# Patient Record
Sex: Female | Born: 1955 | Race: Black or African American | Hispanic: No | Marital: Married | Smoking: Never smoker
Health system: Southern US, Community
[De-identification: ages and names within clinical notes are randomized; demographics above are authoritative.]

## PROBLEM LIST (undated history)

## (undated) DIAGNOSIS — K5900 Constipation, unspecified: Secondary | ICD-10-CM

## (undated) DIAGNOSIS — R002 Palpitations: Secondary | ICD-10-CM

## (undated) DIAGNOSIS — F419 Anxiety disorder, unspecified: Secondary | ICD-10-CM

## (undated) DIAGNOSIS — I471 Supraventricular tachycardia: Secondary | ICD-10-CM

## (undated) DIAGNOSIS — D649 Anemia, unspecified: Secondary | ICD-10-CM

## (undated) HISTORY — PX: SUPRACERVICAL ABDOMINAL HYSTERECTOMY: SHX5393

## (undated) HISTORY — DX: Supraventricular tachycardia: I47.1

## (undated) HISTORY — DX: Anemia, unspecified: D64.9

## (undated) HISTORY — DX: Constipation, unspecified: K59.00

## (undated) HISTORY — DX: Palpitations: R00.2

## (undated) HISTORY — DX: Anxiety disorder, unspecified: F41.9

---

## 1998-06-23 ENCOUNTER — Other Ambulatory Visit: Admission: RE | Admit: 1998-06-23 | Discharge: 1998-06-23 | Payer: Self-pay | Admitting: Obstetrics and Gynecology

## 1999-07-30 ENCOUNTER — Other Ambulatory Visit: Admission: RE | Admit: 1999-07-30 | Discharge: 1999-07-30 | Payer: Self-pay | Admitting: Obstetrics and Gynecology

## 2000-09-30 ENCOUNTER — Other Ambulatory Visit: Admission: RE | Admit: 2000-09-30 | Discharge: 2000-09-30 | Payer: Self-pay | Admitting: Obstetrics and Gynecology

## 2000-10-28 ENCOUNTER — Encounter (INDEPENDENT_AMBULATORY_CARE_PROVIDER_SITE_OTHER): Payer: Self-pay

## 2000-10-28 ENCOUNTER — Other Ambulatory Visit: Admission: RE | Admit: 2000-10-28 | Discharge: 2000-10-28 | Payer: Self-pay | Admitting: Obstetrics and Gynecology

## 2001-04-07 ENCOUNTER — Encounter: Payer: Self-pay | Admitting: Family Medicine

## 2001-04-07 ENCOUNTER — Encounter: Admission: RE | Admit: 2001-04-07 | Discharge: 2001-04-07 | Payer: Self-pay | Admitting: Family Medicine

## 2001-05-28 ENCOUNTER — Encounter: Payer: Self-pay | Admitting: General Surgery

## 2001-06-03 ENCOUNTER — Observation Stay (HOSPITAL_COMMUNITY): Admission: RE | Admit: 2001-06-03 | Discharge: 2001-06-04 | Payer: Self-pay | Admitting: General Surgery

## 2001-06-03 ENCOUNTER — Encounter (INDEPENDENT_AMBULATORY_CARE_PROVIDER_SITE_OTHER): Payer: Self-pay | Admitting: Specialist

## 2002-01-12 ENCOUNTER — Other Ambulatory Visit: Admission: RE | Admit: 2002-01-12 | Discharge: 2002-01-12 | Payer: Self-pay | Admitting: Obstetrics and Gynecology

## 2002-06-23 HISTORY — PX: CHOLECYSTECTOMY: SHX55

## 2003-02-13 ENCOUNTER — Other Ambulatory Visit: Admission: RE | Admit: 2003-02-13 | Discharge: 2003-02-13 | Payer: Self-pay | Admitting: Obstetrics and Gynecology

## 2003-06-19 ENCOUNTER — Encounter (INDEPENDENT_AMBULATORY_CARE_PROVIDER_SITE_OTHER): Payer: Self-pay | Admitting: Specialist

## 2003-06-19 ENCOUNTER — Inpatient Hospital Stay (HOSPITAL_COMMUNITY): Admission: RE | Admit: 2003-06-19 | Discharge: 2003-06-22 | Payer: Self-pay | Admitting: Obstetrics and Gynecology

## 2006-02-19 ENCOUNTER — Other Ambulatory Visit: Admission: RE | Admit: 2006-02-19 | Discharge: 2006-02-19 | Payer: Self-pay | Admitting: Family Medicine

## 2010-01-08 ENCOUNTER — Other Ambulatory Visit: Admission: RE | Admit: 2010-01-08 | Discharge: 2010-01-08 | Payer: Self-pay | Admitting: Family Medicine

## 2010-01-15 ENCOUNTER — Ambulatory Visit (HOSPITAL_COMMUNITY): Admission: RE | Admit: 2010-01-15 | Discharge: 2010-01-15 | Payer: Self-pay | Admitting: Family Medicine

## 2010-06-23 DIAGNOSIS — I471 Supraventricular tachycardia: Secondary | ICD-10-CM

## 2010-06-23 HISTORY — DX: Supraventricular tachycardia: I47.1

## 2010-11-08 NOTE — Op Note (Signed)
NAME:  Brittany Murphy, Brittany Murphy                         ACCOUNT NO.:  [id number]   MEDICAL RECORD NO.:  [id number]                   PATIENT TYPE:  INP   LOCATION:  9399                                 FACILITY:  [facility]   PHYSICIAN:  Charles A. Sydnee Cabal, MD            DATE OF BIRTH:  09/08/1955   DATE OF PROCEDURE:  06/19/2003  DATE OF DISCHARGE:                                 OPERATIVE REPORT   PREOPERATIVE DIAGNOSIS:  Uterine fibroids leading to menorrhagia.   POSTOPERATIVE DIAGNOSIS:  Uterine fibroids leading to menorrhagia.   PROCEDURE:  Transabdominal hysterectomy, supracervical type.   SURGEON:  Charles A. Sydnee Cabal, M.D.   ASSISTANT:  Rudy Jew. Ashley Royalty, M.D.   COMPLICATIONS:  None.   ESTIMATED BLOOD LOSS:  100 mL.   FINDINGS:  Uterine leiomyomata, a 301 g uterus.  Instrument, sponge, and  needle count were correct x2.   ANESTHESIA:  General by the endotracheal route.   DESCRIPTION OF PROCEDURE:  The patient was taken to the operating room,  placed in the supine position, and a general anesthetic was induced without  difficulty.  Sterile prep and drape was then undertaken.  A Pfannenstiel  incision was made with a knife, carried down to fascia.  The fascia was  incised with a knife and Mayo scissors.  The rectus muscles were sharply  dissected in the midline.  The rectus sheath was released superiorly and  inferiorly.  The peritoneum was entered with a hemostat.  There was no  damage to bowel, bladder, or vascular structures.  The peritoneal incision  was extended superiorly and inferiorly.  There was no damage to the bladder.  A Balfour retractor was placed.  Three moistened laps were used to pack the  bowel out of the pelvis.  The uterine cornual regions were grasped with  Kocher clamps.  The round ligaments were transected on either side and  transfixed and stitched with 0 Vicryl.  The broad ligament was opened.  The  bladder was dropped down anteriorly off the lower uterine  segment.  Utero-  ovarian pedicles were isolated on either side, crossclamped, and ovarian  pedicles were free-tied and then transfixed and stitched with 0 Vicryl, with  good hemostasis resulting.  The uterine vessels were skeletonized  bilaterally.  The bladder was taken down carefully anteriorly, allowing the  uterine vessels to be skeletonized.  These were crossclamped and simple-  stitched with good hemostasis with 0 Vicryl on either side.  Further pedicle  was taken down to include some of the cardinal ligaments on either side to  expose the lower uterine segment and cervix area.  These were transfixed and  stitched with 0 Vicryl with good hemostasis resulting.  Per patient request,  a supracervical hysterectomy was then done with cutting across the isthmus  of the lower uterine segment.  This was then closed with interrupted figure-  of-eight sutures of 0 Vicryl with good hemostasis resulting.  A single 2-0  Vicryl figure-of-eight suture was used to achieve hemostasis on the  cervical/isthmic stump.  Irrigation was carried out.  All pedicles were  noted to have excellent hemostasis.  Cervix hemostasis was good.  All  instruments were removed. Laps were removed.  Balfour retractor was removed.  Subfascial hemostasis was verified.  The fascia was then  closed with #1 Vicryl running, nonlocking suture.  Subcutaneous hemostasis  was good.  Sterile skin clips were used to close the skin.  A sterile  dressing was applied.  The patient tolerated the procedure well and was  taken to recovery in stable condition.                                               Charles A. Sydnee Cabal, MD    CAD/MEDQ  D:  06/19/2003  T:  06/19/2003  Job:  [id number]

## 2010-11-08 NOTE — Op Note (Signed)
[facility]  Patient:    Brittany Murphy, Brittany Murphy Visit Number: [id number] MRN: [id number]          Service Type: SUR Location: 4W [id number] Attending Physician:  Caleen Essex Dictated by:   Ollen Gross. Vernell Morgans, M.D. Proc. Date: 06/03/01 Admit Date:  06/03/2001 Discharge Date: 06/04/2001                             Operative Report  PREOPERATIVE DIAGNOSIS:  Cholelithiasis.  POSTOPERATIVE DIAGNOSIS:  Cholecystitis with cholelithiasis.  PROCEDURE:  Laparoscopic cholecystectomy.  SURGEON:  Ollen Gross. Vernell Morgans, M.D.  ASSISTANT:  Rose Phi. Maple Hudson, M.D.  ANESTHESIA:  General endotracheal.  DESCRIPTION OF PROCEDURE:  After informed consent was obtained, the patient was brought to the operating room and placed in the supine position on the operating table.  After adequate induction of general anesthesia, the patients abdomen was prepped with Betadine and draped in the usual sterile manner.  The area above the umbilicus was infiltrated with 0.25% Marcaine, and a small transverse incision was made with the 15 blade knife.  This incision was carried down through the subcutaneous tissue using blunt dissection with a Kelly clamp and [employer] retractors until the linea alba was identified.  The linea alba was incised with the 15 blade knife, and each side was grasped with Kocher clamps and elevated anteriorly.  The preperitoneal space was probed bluntly with a hemostat until the peritoneum was open and access was gained to the abdominal cavity.  A finger was inserted through this opening to palpate the anterior abdominal wall, and there were no apparent adhesions.  A 0 Vicryl pursestring stitch was placed in the fascia surrounding this opening, and a Hasson cannula was placed through this opening into the abdominal cavity and anchored in place with the previously-placed Vicryl pursestring stitch.  The abdomen was then insufflated with carbon dioxide without difficulty.   The patient was placed in the head-up position.  A small transverse upper midline incision was made after infiltrating this area with 0.25% Marcaine, and a 10 mm port was placed bluntly through this incision, into the abdomen cavity under direct vision.  Sites were then chosen laterally on the right side of the  right side of the abdomen for placement of 5 mm ports, and each of these areas were infiltrated with 0.25% Marcaine.  Small stab incisions were made with the 15 blade knife, and 5 mm ports were placed bluntly through these incisions into the abdominal cavity under direct vision.  A blunt grasper was placed through the lateral-most 5 mm port and used to grasp the dome of the gallbladder and elevate it anteriorly and superiorly.  Another blunt grasper was placed through the other 5 mm port and used to retract on the body and neck of the gallbladder.  A blunt dissector was placed through the upper midline port and using the electrocautery, the peritoneal reflection over top of the gallbladder neck area was opened.  Blunt dissection was then carried out in this area until the gallbladder neck cystic duct junction was readily identified and dissected in a circumferential manner, creating a good window. Care was taken to keep the common duct medial to the dissection.  Three clips were then placed proximally and one distally on the cystic duct, and the duct was divided between the two.  Posterior to this, the cystic artery was identified and dissected bluntly in a circumferential manner until  a good window was created.  Two clips were placed proximally and one distally on the artery, and the artery was divided between the two.  Next, a laparoscopic hook cautery was used to separate the gallbladder from the liver bed.  Prior to completely detaching the gallbladder from the liver bed, the liver bed was inspected, and several small bleeding points were coagulated with the Bovie electrocautery  until the liver bed was hemostatic.  The gallbladder was then detached the rest of the way from the liver bed using the hook cautery.  The laparoscope was then moved to the upper midline port, and a gallbladder grasper was placed through the Hasson cannula to grasp the neck of the gallbladder.  The gallbladder was then removed with the Hasson cannula through the supraumbilical port.  The Hasson cannula was replaced, and the laparoscope was moved back to the Hasson cannula.  The abdomen was inspected again, and the liver bed was found to be hemostatic.  The abdomen was then irrigated with copious amounts of saline until the effluent was clear.  The laparoscope was removed back to the upper midline port, and the Hasson cannula was removed, and the fascial defect was closed with the previously-placed Vicryl pursestring stitch under direct vision.  The rest of the ports were also removed under direct vision after allowing the air to escape and were found to be hemostatic.  The skin incisions were then closed with interrupted 4-0 Monocryl subcuticular stitches.  Benzoin, Steri-Strips were applied.  The patient tolerated the procedure well.  At the end of case, all sponge, needle, and instrument counts were correct.  The patient was awakened and taken to the recovery room in stable condition. Dictated by:   Ollen Gross. Vernell Morgans, M.D. Attending Physician:  Caleen Essex DD:  07/13/01 TD:  07/14/01 Job: [id number]

## 2010-11-08 NOTE — Discharge Summary (Signed)
NAME:  Brittany Murphy, Brittany Murphy                         ACCOUNT NO.:  [id number]   MEDICAL RECORD NO.:  [id number]                   PATIENT TYPE:  INP   LOCATION:  9309                                 FACILITY:  [facility]   PHYSICIAN:  Charles A. Sydnee Cabal, MD            DATE OF BIRTH:  May 15, 1956   DATE OF ADMISSION:  06/19/2003  DATE OF DISCHARGE:  06/22/2003                                 DISCHARGE SUMMARY   PRIMARY DISCHARGE DIAGNOSIS:  Uterine fibroids.   PROCEDURE:  Transabdominal supracervical hysterectomy.   HISTORY AND PHYSICAL:  See dictated note on the chart.   LABORATORY DATA:  Postoperative hemoglobin 11.4, hematocrit 32.6.   HOSPITAL COURSE:  The patient was admitted, underwent surgery as noted above  secondary to menorrhagia and other symptoms from the uterine fibroids.  Postoperatively, the patient did well and had a routine postoperative  course.  She voided without difficulty on postoperative day #1.  Clear  liquids were given.  She had spontaneous return of flatus on postoperative  day #2.  Was given general diet.  Pain was controlled after PCA was  discontinued on postoperative day #1, having used the Dilaudid PCA, been  changed over to Percocet p.o.  She ambulated, continued to do well, pain was  controlled, diet was tolerated, flatus continued, bowel function was normal,  voiding was normal, and she was therefore discharged on postoperative day  #3.  She was given convalescent instructions of temperature greater than  101, increased pain or vaginal bleeding.  No driving for two weeks, no  lifting greater than 20 pounds.  Return to clinic in 24 hours to DC staples.  Percocet 5/325 one to two p.o. q.4h. p.r.n. #40 prescription was given with  instructions.                                               Charles A. Sydnee Cabal, MD    CAD/MEDQ  D:  06/22/2003  T:  06/22/2003  Job:  [id number]

## 2011-01-17 ENCOUNTER — Other Ambulatory Visit: Payer: Self-pay | Admitting: Family Medicine

## 2011-01-17 DIAGNOSIS — R51 Headache: Secondary | ICD-10-CM

## 2011-01-21 ENCOUNTER — Ambulatory Visit
Admission: RE | Admit: 2011-01-21 | Discharge: 2011-01-21 | Disposition: A | Discharge status: Home / Self Care | Payer: BC Managed Care – PPO | Source: Ambulatory Visit | Attending: Family Medicine | Admitting: Family Medicine

## 2011-01-21 DIAGNOSIS — R51 Headache: Secondary | ICD-10-CM

## 2011-03-10 ENCOUNTER — Emergency Department (HOSPITAL_COMMUNITY): Payer: BC Managed Care – PPO

## 2011-03-10 ENCOUNTER — Inpatient Hospital Stay (HOSPITAL_COMMUNITY)
Admission: EM | Admit: 2011-03-10 | Discharge: 2011-03-11 | DRG: 139 | Disposition: A | Discharge status: Home / Self Care | Payer: BC Managed Care – PPO | Attending: Cardiology | Admitting: Cardiology

## 2011-03-10 DIAGNOSIS — R42 Dizziness and giddiness: Secondary | ICD-10-CM | POA: Diagnosis present

## 2011-03-10 DIAGNOSIS — I498 Other specified cardiac arrhythmias: Principal | ICD-10-CM | POA: Diagnosis present

## 2011-03-10 DIAGNOSIS — R000 Tachycardia, unspecified: Secondary | ICD-10-CM

## 2011-03-10 LAB — COMPREHENSIVE METABOLIC PANEL
ALT: 38 U/L — ABNORMAL HIGH (ref 0–35)
BUN: 18 mg/dL (ref 6–23)
CO2: 28 mEq/L (ref 19–32)
Calcium: 9.8 mg/dL (ref 8.4–10.5)
Creatinine, Ser: 0.9 mg/dL (ref 0.50–1.10)
GFR calc Af Amer: >60 mL/min (ref >60)
GFR calc non Af Amer: >60 mL/min (ref >60)
Glucose, Bld: 100 mg/dL — ABNORMAL HIGH (ref 70–99)

## 2011-03-10 LAB — POCT I-STAT TROPONIN I: Troponin i, poc: 0.46 ng/mL (ref 0.00–0.08)

## 2011-03-10 LAB — CBC
HCT: 37.5 % (ref 36.0–46.0)
Hemoglobin: 12.8 g/dL (ref 12.0–15.0)
MCH: 29.8 pg (ref 26.0–34.0)
MCV: 87.4 fL (ref 78.0–100.0)
RBC: 4.29 MIL/uL (ref 3.87–5.11)

## 2011-03-10 LAB — DIFFERENTIAL
Lymphs Abs: 2.5 10*3/uL (ref 0.7–4.0)
Monocytes Absolute: 0.6 10*3/uL (ref 0.1–1.0)
Monocytes Relative: 8 % (ref 3–12)
Neutro Abs: 4.9 10*3/uL (ref 1.7–7.7)
Neutrophils Relative %: 61 % (ref 43–77)

## 2011-03-10 LAB — CK TOTAL AND CKMB (NOT AT ARMC): CK, MB: 4.2 ng/mL — ABNORMAL HIGH (ref 0.3–4.0)

## 2011-03-11 LAB — LIPID PANEL
Cholesterol: 133 mg/dL (ref 0–200)
VLDL: 14 mg/dL (ref 0–40)

## 2011-03-11 LAB — CARDIAC PANEL(CRET KIN+CKTOT+MB+TROPI)
CK, MB: 4 ng/mL (ref 0.3–4.0)
Total CK: 63 U/L (ref 7–177)

## 2011-03-21 NOTE — Discharge Summary (Signed)
Brittany Murphy, Brittany Murphy               ACCOUNT NO.:  [id number]  MEDICAL RECORD NO.:  [id number]  LOCATION:  3701                         FACILITY:  [facility]  PHYSICIAN:  Jake Bathe, MD      DATE OF BIRTH:  [birth year]  DATE OF ADMISSION:  03/10/2011 DATE OF DISCHARGE:  03/11/2011                              DISCHARGE SUMMARY   CARDIOLOGIST:  Jake Bathe, MD  PRIMARY PHYSICIAN:  [name], MD, [facility] Sprang  FINAL DIAGNOSES: 1. Supraventricular tachycardia. 2. Mildly elevated troponin in the setting of tachycardia. 3. Dizziness. 4. Prior history of anemia. 5. Cholecystectomy. 6. Hysterectomy.  BRIEF HOSPITAL COURSE:  This is a 55 year old female, originally from [place], with no prior cardiac history except for occasional bursts of brief palpitations who was in her usual state of health until yesterday morning developed a rapid heart rate that was incessant.  She felt her heart beating "fast" and felt somewhat lightheaded and had some mild blurry vision associated with this as well as dizziness.  She started to feel better and at one point decided that she would not go to the doctor and then because the heart rate continued without any resolution, she ended up going to see Dr. Maurice Small at [facility] and an EKG was performed which demonstrated narrow complex tachycardia at approximately 150 beats per minute, specifically 146 beats per minute with no clear P-wave preceding the QRS complex but perhaps a retrograde P-wave following.  She did not describe any chest discomfort or arm discomfort, but she said she felt as though it is a little bit more difficult to move her jaw.  Carotid massage was tried but it did not break the arrhythmia, so she went to the emergency room.  She did convert to normal sinus rhythm with IV Cardizem shortly thereafter. Point-of-care cardiac troponin did increase slightly, likely as a result of the tachycardia from 0.1 to 0.4, to 0.7.   Currently, trending down to 0.6 with an MB of 4 and a CK of 63.  Her TSH was normal as well as her free T4 and her electrolytes were normal with a potassium of 4.7, sodium 137, BUN 18, creatinine 0.9, ALT slightly above normal at 38 and AST slightly above normal at 43, albumin was 4.1.  Her chest x-ray personally viewed shows no acute findings.  On morning of discharge, she was in sinus rhythm all night with a blood pressure ranging between 115/74 to 101/68, satting 96% on room air, temperature 97.7.  She denies any recent fevers, chills, arthralgias, rashes, amphetamine use, decongestant use, excessive caffeine use.  She does drink a few cups of tea.  This morning, she feels as though she is in her usual state of health.  She feels well.  DISCHARGE MEDICATIONS:  Dr. Antoine Poche started her on a very low-dose beta- blocker, metoprolol tartrate 12.5 mg twice a day and we will continue with this low dose.  She does have a low normal blood pressure, and we do not want to drop this too much.  If she were to have tachycardia despite her beta-blocker therapy, we would at that point move forward with EP study/EP consultation with ablative  therapy.  Other home meds include multivitamin; Vivelle-Dot 0.0375 mg per 24-hour patch biweekly, one patch to skin twice a week.  Once again, she is a nonsmoker, nondiabetic.  No early family history of coronary artery disease.  No family history of early sudden death.  Since her mildly elevated troponin is trending downward and is likely a result of the incessant tachycardia, I feel comfortable allowing her to be discharged with close outpatient stress test.  We will go ahead and get her set up for a nuclear stress test and an echocardiogram to demonstrate her structure, function and to make sure that she does not have any high risk perfusion defects.  We will continue her low-dose metoprolol.  DISCHARGE TIME:  Thirty-five minutes spent with med  reconciliation, review of records, the patient instruction.  I am fine with her being out of work until the stress test is completed.     Jake Bathe, MD     MCS/MEDQ  D:  03/11/2011  T:  03/11/2011  Job:  [id number]  cc:   [name], M.D.  Electronically Signed by Donato Schultz MD on 03/21/2011 06:46:36 AM

## 2011-04-03 NOTE — H&P (Signed)
NAMEVICKEY, Brittany Murphy               ACCOUNT NO.:  [id number]  MEDICAL RECORD NO.:  [id number]  LOCATION:  3701                         FACILITY:  [facility]  PHYSICIAN:  Rollene Rotunda, MD, FACCDATE OF BIRTH:  [birth year]  DATE OF ADMISSION:  03/10/2011 DATE OF DISCHARGE:                             HISTORY & PHYSICAL   PRIMARY:  [name], MD  CARDIOLOGIST:  None.  REASON FOR PRESENTATION:  Evaluate patient with tachycardia.  HISTORY OF PRESENT ILLNESS:  The patient is a lovely 55 year old originally from [place].  She has no prior cardiac history.  She had some rare palpitations that have been and short-lived in the past, however, this morning she developed rapid heart rate.  She felt her heart beating fast.  She was lightheaded.  She did not have any syncope.  She did not have any chest discomfort or arm discomfort.  She did feel like it was a little more difficult to move her jaw.  She went to her primary provider and was noted to have a narrow complex tachycardia.  She describes getting carotid massage, but it did not break her arrhythmia.  She presented to the emergency room.  I do note that her rate was 150.  She did convert to normal sinus rhythm with IV Cardizem.  Cardiac markers have been slightly elevated peaking thus far on the second set at 0.46. There is no CK-MB.  Followup EKG demonstrated no acute ST-segment changes.  The patient otherwise has been healthy.  She started exercising couple of weeks ago and has had no problems with this.  She has not had any chest pressure, neck, or arm discomfort.  She has not had any shortness of breath, PND, or orthopnea.  PAST MEDICAL HISTORY:  Anemia.  PAST SURGICAL HISTORY:  Cholecystectomy, hysterectomy.  ALLERGIES INTOLERANCES:  None.  MEDICATIONS:  Vivelle-Dot, multivitamin.  SOCIAL HISTORY:  The patient is married.  She works at [employer], in systems administration.  She has two children and no grandchildren.   She has never smoked cigarettes.  FAMILY HISTORY:  Noncontributory for early coronary artery disease, sudden cardiac death, heart failure, syncope or other.  REVIEW OF SYSTEMS:  As stated in the HPI and positive for some mild GI upset, but otherwise negative for all other systems.  PHYSICAL EXAMINATION:  GENERAL:  The patient is in no distress. VITAL SIGNS:  Blood pressure 101/68, heart rate 71 and regular, respiratory rate 16. HEENT:  Eyelids are unremarkable.  Pupils equal, round, and reactive to light, fundi visualized, oral mucosa unremarkable. NECK:  No jugular distention at 45 degrees, carotid upstroke brisk and symmetrical.  No bruits, no thyromegaly. LYMPHATICS:  No cervical, axillary, or inguinal adenopathy. LUNGS:  Clear to auscultation bilaterally. BACK:  No costovertebral angle tenderness. CHEST:  Unremarkable. HEART:  PMI not displaced or sustained, S1 and S2 within normal limits. No S3, no S4, no clicks, rubs, or murmurs. ABDOMEN:  Flat, positive bowel sounds, normal frequency and pitch.  No bruits, rebound, guarding, or midline pulsatile mass.  No hepatomegaly, no splenomegaly. SKIN:  No rashes, no nodules. EXTREMITIES:  2+ pulses throughout. No edema, no cyanosis, and no clubbing. NEUROLOGIC:  Oriented to  person, place, and time.  Cranial nerves II-XII grossly intact.  Motor grossly intact.  EKG sinus rhythm, rate 78, axis within normal limits, intervals within normal limits, no acute ST-T wave changes.  ASSESSMENT AND PLAN: 1. Tachycardia.  The patient has supraventricular tachycardia.  At     this point, I will start a low-dose beta blocker.  I will check a     TSH.  She could have an outpatient evaluation to consider an EP     evaluation particularly if she has recurrence. 2. Elevated troponin.  This is probably related to tachycardia.     However, she did have some mild jaw discomfort.  I will cycle     cardiac enzymes, repeat an EKG in the morning.  If  she has no     further dysrhythmia, discomfort, enzyme elevation, or EKG changes,     I would suggest outpatient stress testing.     Rollene Rotunda, MD, [facility]     JH/MEDQ  D:  03/10/2011  T:  03/11/2011  Job:  [id number]  Electronically Signed by Rollene Rotunda MD [facility] on 04/03/2011 01:36:50 PM

## 2012-01-20 ENCOUNTER — Other Ambulatory Visit (HOSPITAL_COMMUNITY)
Admission: RE | Admit: 2012-01-20 | Discharge: 2012-01-20 | Disposition: A | Discharge status: Home / Self Care | Payer: BC Managed Care – PPO | Source: Ambulatory Visit | Attending: Family Medicine | Admitting: Family Medicine

## 2012-01-20 ENCOUNTER — Other Ambulatory Visit: Payer: Self-pay | Admitting: Family Medicine

## 2012-01-20 DIAGNOSIS — Z124 Encounter for screening for malignant neoplasm of cervix: Secondary | ICD-10-CM | POA: Insufficient documentation

## 2012-11-18 ENCOUNTER — Other Ambulatory Visit (HOSPITAL_COMMUNITY): Payer: Self-pay | Admitting: Family Medicine

## 2012-11-18 DIAGNOSIS — Z1231 Encounter for screening mammogram for malignant neoplasm of breast: Secondary | ICD-10-CM

## 2012-11-19 ENCOUNTER — Ambulatory Visit (HOSPITAL_COMMUNITY)
Admission: RE | Admit: 2012-11-19 | Discharge: 2012-11-19 | Disposition: A | Discharge status: Home / Self Care | Payer: BC Managed Care – PPO | Source: Ambulatory Visit | Attending: Family Medicine | Admitting: Family Medicine

## 2012-11-19 DIAGNOSIS — Z1231 Encounter for screening mammogram for malignant neoplasm of breast: Secondary | ICD-10-CM | POA: Insufficient documentation

## 2013-07-27 ENCOUNTER — Telehealth: Payer: Self-pay | Admitting: Cardiology

## 2013-07-27 NOTE — Telephone Encounter (Signed)
New problem   Pt having rapid heartbeat. Please call pt

## 2013-07-29 NOTE — Telephone Encounter (Signed)
Left a message on answering machine to call the office.

## 2013-08-29 NOTE — Telephone Encounter (Signed)
No answer closing telephone note.

## 2014-02-03 ENCOUNTER — Encounter: Payer: Self-pay | Admitting: *Deleted

## 2014-03-20 ENCOUNTER — Emergency Department (HOSPITAL_COMMUNITY)
Admission: EM | Admit: 2014-03-20 | Discharge: 2014-03-21 | Disposition: A | Discharge status: Home / Self Care | Payer: BC Managed Care – PPO | Attending: Emergency Medicine | Admitting: Emergency Medicine

## 2014-03-20 ENCOUNTER — Encounter (HOSPITAL_COMMUNITY): Payer: Self-pay | Admitting: Emergency Medicine

## 2014-03-20 DIAGNOSIS — R002 Palpitations: Secondary | ICD-10-CM | POA: Insufficient documentation

## 2014-03-20 DIAGNOSIS — Z79899 Other long term (current) drug therapy: Secondary | ICD-10-CM | POA: Diagnosis not present

## 2014-03-20 DIAGNOSIS — Z8719 Personal history of other diseases of the digestive system: Secondary | ICD-10-CM | POA: Diagnosis not present

## 2014-03-20 DIAGNOSIS — I498 Other specified cardiac arrhythmias: Secondary | ICD-10-CM | POA: Diagnosis not present

## 2014-03-20 DIAGNOSIS — Z862 Personal history of diseases of the blood and blood-forming organs and certain disorders involving the immune mechanism: Secondary | ICD-10-CM | POA: Insufficient documentation

## 2014-03-20 DIAGNOSIS — I471 Supraventricular tachycardia: Secondary | ICD-10-CM

## 2014-03-20 LAB — I-STAT CHEM 8, ED
BUN: 19 mg/dL (ref 6–23)
CALCIUM ION: 1.17 mmol/L (ref 1.12–1.23)
CREATININE: 1 mg/dL (ref 0.50–1.10)
Chloride: 107 mEq/L (ref 96–112)
Glucose, Bld: 84 mg/dL (ref 70–99)
HCT: 40 % (ref 36.0–46.0)
HEMOGLOBIN: 13.6 g/dL (ref 12.0–15.0)
Potassium: 4.2 mEq/L (ref 3.7–5.3)
SODIUM: 142 meq/L (ref 137–147)
TCO2: 24 mmol/L (ref 0–100)

## 2014-03-20 LAB — I-STAT TROPONIN, ED
TROPONIN I, POC: 0.4 ng/mL — AB (ref 0.00–0.08)
Troponin i, poc: 0.32 ng/mL (ref 0.00–0.08)

## 2014-03-20 LAB — CBC WITH DIFFERENTIAL/PLATELET
BASOS PCT: 0 % (ref 0–1)
Basophils Absolute: 0 10*3/uL (ref 0.0–0.1)
EOS ABS: 0 10*3/uL (ref 0.0–0.7)
EOS PCT: 0 % (ref 0–5)
HEMATOCRIT: 36 % (ref 36.0–46.0)
HEMOGLOBIN: 12.2 g/dL (ref 12.0–15.0)
LYMPHS ABS: 1.4 10*3/uL (ref 0.7–4.0)
Lymphocytes Relative: 19 % (ref 12–46)
MCH: 29.9 pg (ref 26.0–34.0)
MCHC: 33.9 g/dL (ref 30.0–36.0)
MCV: 88.2 fL (ref 78.0–100.0)
MONO ABS: 0.7 10*3/uL (ref 0.1–1.0)
MONOS PCT: 9 % (ref 3–12)
Neutro Abs: 5 10*3/uL (ref 1.7–7.7)
Neutrophils Relative %: 72 % (ref 43–77)
Platelets: 217 10*3/uL (ref 150–400)
RBC: 4.08 MIL/uL (ref 3.87–5.11)
RDW: 13.2 % (ref 11.5–15.5)
WBC: 7.1 10*3/uL (ref 4.0–10.5)

## 2014-03-20 MED ORDER — ADENOSINE 6 MG/2ML IV SOLN
6.0000 mg | Freq: Once | INTRAVENOUS | Status: AC
  Administered 2014-03-20: 6 mg via INTRAVENOUS
  Filled 2014-03-20: qty 2

## 2014-03-20 MED ORDER — SODIUM CHLORIDE 0.9 % IV SOLN
Freq: Once | INTRAVENOUS | Status: AC
  Administered 2014-03-20: 18:00:00 via INTRAVENOUS

## 2014-03-20 NOTE — ED Notes (Signed)
Pt st's he heart has been beating fast since approx 11:00 this am.  St's she went to her MD's office and was told to come to ED.  Pt denies any chest pain or discomfort

## 2014-03-20 NOTE — ED Notes (Signed)
Pt given Adenosine  rapid IV push with Dr Fonnie Jarvis at bedside.  Pt tolerated well.  Pt converted to NSR.  Pt continues to deny any chest pain or discomfort

## 2014-03-20 NOTE — ED Notes (Signed)
Pt placed on cardiac monitor at bedside.  Placed on 02 at 2LPM via [place].

## 2014-03-20 NOTE — ED Provider Notes (Signed)
CSN: [id number]     Arrival date & time 03/20/14  1640 History   First MD Initiated Contact with Patient 03/20/14 1713     Chief Complaint  Patient presents with  . Palpitations     (Consider location/radiation/quality/duration/timing/severity/associated sxs/prior Treatment) HPI 58 year old female with history of SVT converted with adenosine 3 years ago with slight elevation in troponin at that time has done well for the last few years with palpitations onset today about 11:00 this morning constant since then with slight lightheadedness but no syncope no blindness no lateralizing weakness or numbness no chest pain no shortness of breath no other concerns. There is no treatment prior to arrival. Past Medical History  Diagnosis Date  . Anemia   . Constipation   . SVT (supraventricular tachycardia) 2012   Past Surgical History  Procedure Laterality Date  . Cholecystectomy  2004  . Supracervical abdominal hysterectomy     No family history on file. History  Substance Use Topics  . Smoking status: Never Smoker   . Smokeless tobacco: Not on file  . Alcohol Use: Not on file   OB History   Grav Para Term Preterm Abortions TAB SAB Ect Mult Living                 Review of Systems 10 Systems reviewed and are negative for acute change except as noted in the HPI.   Allergies  Metoprolol  Home Medications   Prior to Admission medications   Medication Sig Start Date End Date Taking? Authorizing Provider  B Complex Vitamins (VITAMIN B COMPLEX PO) Take 1 tablet by mouth daily.   Yes Historical Provider, MD  Biotin 5 MG CAPS Take 1 capsule by mouth daily.   Yes Historical Provider, MD  Calcium Carb-Cholecalciferol (CALCIUM + D3) 600-200 MG-UNIT TABS Take 1 tablet by mouth daily.   Yes Historical Provider, MD  Multiple Vitamin (MULTIVITAMIN WITH MINERALS) TABS tablet Take 1 tablet by mouth daily.   Yes Historical Provider, MD   BP 126/66  Pulse 87  Temp(Src) 98.1 F (36.7 C)  (Oral)  Resp 18  Ht  (1.626 m)  Wt 123 lb (55.792 kg)  BMI 21.10 kg/m2  SpO2 100% Physical Exam  Nursing note and vitals reviewed. Constitutional:  Awake, alert, nontoxic appearance.  HENT:  Head: Atraumatic.  Eyes: Right eye exhibits no discharge. Left eye exhibits no discharge.  Neck: Neck supple.  Cardiovascular: Regular rhythm.   No murmur heard. Tachycardic  Pulmonary/Chest: Effort normal and breath sounds normal. No respiratory distress. She has no wheezes. She has no rales. She exhibits no tenderness.  Pulse oximetry normal room-air 99%  Abdominal: Soft. Bowel sounds are normal. She exhibits no distension. There is no tenderness. There is no rebound and no guarding.  Musculoskeletal: She exhibits no edema and no tenderness.  Baseline ROM, no obvious new focal weakness.  Neurological:  Mental status and motor strength appears baseline for patient and situation.  Skin: No rash noted.  Psychiatric: She has a normal mood and affect.    ED Course  Procedures (including critical care time) Labs Review Labs Reviewed  I-STAT TROPOININ, ED - Abnormal; Notable for the following:    Troponin i, poc 0.32 (*)    All other components within normal limits  I-STAT TROPOININ, ED - Abnormal; Notable for the following:    Troponin i, poc 0.40 (*)    All other components within normal limits  CBC WITH DIFFERENTIAL  I-STAT CHEM 8, ED  I-STAT  TROPOININ, ED   Patient converted to sinus rhythm after 6 mg adenosine.  D/w Cards troponin >12 hours from SVT start only 0.4 in Pt with PMH of similar mild rise in troponin with SVT 3 years ago. Pt states had unremarkable f/u echo and stress test at [facility] at that time but didn't tolerate low dose beta-blocker yet no recurrence of SVT until today. Cards agrees with discharge and OutPt f/u. Doubt ACS/AMI. Patient / Family / Caregiver informed of clinical course, understand medical decision-making process, and agree with plan. Imaging  Review No results found.   EKG Interpretation   Date/Time:  Monday March 20 2014 19:03:28 EDT Ventricular Rate:  83 PR Interval:  150 QRS Duration: 79 QT Interval:  382 QTC Calculation: 449 R Axis:   83 Text Interpretation:  Sinus rhythm Compared to previous tracing  Supraventricular tachycardia NO LONGER PRESENT Confirmed by Fonnie Jarvis  MD,  Jonny Ruiz ([id number]) on 03/20/2014 7:17:38 PM      MDM   Final diagnoses:  SVT (supraventricular tachycardia)    I doubt any other EMC precluding discharge at this time including, but not necessarily limited to the following:ACS, AMI.    Hurman Horn, MD 03/22/14 [phone]

## 2014-03-20 NOTE — ED Notes (Signed)
Pt reports sudden onset of lightheadedness and blurred vision at 1140am.  Pt went to PCP office and was brought to ED.  Pt current HR 160bpm.  Pt denies pain or shortness of breath.

## 2014-03-20 NOTE — ED Notes (Signed)
Pt ambulatory to bathroom without any problems 

## 2014-03-23 ENCOUNTER — Telehealth: Payer: Self-pay | Admitting: Cardiology

## 2014-03-23 NOTE — Telephone Encounter (Signed)
Left detailed message that per Dr. Anne FuSkains last [facility] on 03/22/[facility],  "My recommendation to her is to see an electrophysiologist for ablative procedure consultation.  At this time, she would like to track symptoms further and call me when she is ready to see the electrophysiologist. She is unable to tolerate her medications.  She is able to utilize vagal maneuvers to break tachycardia."  I advised in message that I will send request to EP Scheduler, Glynda JaegerMelissa Tatum and that she should receive a call for appointment.  I advised patient to call our office with questions or concerns.

## 2014-03-23 NOTE — Telephone Encounter (Signed)
New problem:  Pt needs a call back please for a REFERRAL for ablation to a [facility] ...per pt this the next step and  [facility]   Pt gave verbal permission to leave voice mail message on work phone.[phone]  Please give pt a call back soon.

## 2014-03-26 NOTE — Telephone Encounter (Signed)
Thank you :)

## 2014-04-13 ENCOUNTER — Encounter: Payer: Self-pay | Admitting: Internal Medicine

## 2014-04-13 ENCOUNTER — Ambulatory Visit (INDEPENDENT_AMBULATORY_CARE_PROVIDER_SITE_OTHER): Payer: BC Managed Care – PPO | Admitting: Internal Medicine

## 2014-04-13 VITALS — BP 124/76 | HR 79 | Ht 64.0 in | Wt 125.4 lb

## 2014-04-13 DIAGNOSIS — I471 Supraventricular tachycardia: Secondary | ICD-10-CM

## 2014-04-13 DIAGNOSIS — F419 Anxiety disorder, unspecified: Secondary | ICD-10-CM

## 2014-04-13 HISTORY — DX: Anxiety disorder, unspecified: F41.9

## 2014-04-13 NOTE — Progress Notes (Signed)
      HPI Brittany Murphy is referred today for consideration of catheter ablation. She is a very pleasant 58 yo woman who is a native of [place]. She has had heart racing for 3 years. These episodes start and stop suddenly and are associated with dizziness and light headedness. She has not had frank syncope. She tried taking metoprolol and had severe fatigue and hair loss. She took cardizem and it was ineffective. She had an episode several weeks ago which required a visit to the ED.  Allergies  Allergen Reactions  . Metoprolol Other (See Comments)    Hair loss. Patient experienced many side effects from med, stopped taking as result     Current Outpatient Prescriptions  Medication Sig Dispense Refill  . B Complex Vitamins (VITAMIN B COMPLEX PO) Take 1 tablet by mouth daily.      . Calcium Carb-Cholecalciferol (CALCIUM + D3) 600-200 MG-UNIT TABS Take 1 tablet by mouth daily.      . COPPER PO Take 2 mg by mouth daily.      . Multiple Vitamin (MULTIVITAMIN WITH MINERALS) TABS tablet Take 1 tablet by mouth daily.      . NON FORMULARY Cayenne - 40,0000 STU - Take 1 capsule by mouth daily      . OVER THE COUNTER MEDICATION VitaMelts - Vitamin C & Biotin - Take 1 capsule by mouth daily      . OVER THE COUNTER MEDICATION Black Seed - Take 500 mg by mouth twice daily       No current facility-administered medications for this visit.     Past Medical History  Diagnosis Date  . Anemia   . Constipation   . SVT (supraventricular tachycardia) 2012  . Palpitations     ROS:   All systems reviewed and negative except as noted in the HPI.   Past Surgical History  Procedure Laterality Date  . Cholecystectomy  2004  . Supracervical abdominal hysterectomy       No family history on file.   History   Social History  . Marital Status: Married    Spouse Name: N/A    Number of Children: N/A  . Years of Education: N/A   Occupational History  . Not on file.   Social History Main Topics    . Smoking status: Never Smoker   . Smokeless tobacco: Not on file  . Alcohol Use: No  . Drug Use: No  . Sexual Activity: Not on file   Other Topics Concern  . Not on file   Social History Narrative  . No narrative on file     BP 124/76  Pulse 79  Ht 5\' 4"  (1.626 m)  Wt 125 lb 6.4 oz (56.881 kg)  BMI 21.51 kg/m2  Physical Exam:  Well appearing middle aged woman, looking younger than her stated age, NAD HEENT: Unremarkable Neck:  No JVD, no thyromegally Back:  No CVA tenderness Lungs:  Clear with no wheezes HEART:  Regular rate rhythm, no murmurs, no rubs, no clicks Abd:  soft, positive bowel sounds, no organomegally, no rebound, no guarding Ext:  2 plus pulses, no edema, no cyanosis, no clubbing Skin:  No rashes no nodules Neuro:  CN II through XII intact, motor grossly intact  EKG - nsr with no pre-excitation   Assess/Plan:

## 2014-04-13 NOTE — Patient Instructions (Signed)
Your physician has recommended that you have an ablation. Catheter ablation is a medical procedure used to treat some cardiac arrhythmias (irregular heartbeats). During catheter ablation, a long, thin, flexible tube is put into a blood vessel in your groin (upper thigh), or neck. This tube is called an ablation catheter. It is then guided to your heart through the blood vessel. Radio frequency waves destroy small areas of heart tissue where abnormal heartbeats may cause an arrhythmia to start. Please see the instruction sheet given to you today.  Call back if you wish to proceed  11/11,11/16,11/18,11/23,12/2,12/4,12/7,12/9,12/11,12/14,05/1611/28,05/3100/06,06/799/11,01/14,01/20,01/21,01/27  Thank you for choosing [facility]!!     Dennis BastKelly Lanier, RN [phone]

## 2014-04-13 NOTE — Assessment & Plan Note (Signed)
Her underlying anxiety is made much worse about worry over recurrent SVT. "I cant help but wondering when my heart is going to race again." I have tried to reassure the patient.

## 2014-04-13 NOTE — Assessment & Plan Note (Signed)
Her symptoms are present despite medical therapy. She is quite anxious about recurrent SVT but also about considering catheter ablation. The risks/benefits/goals/expectations of the procedure have been discussed with the patient and she will call us if she chooses to proceed with catheter ablation.

## 2014-05-11 ENCOUNTER — Other Ambulatory Visit (HOSPITAL_COMMUNITY): Payer: Self-pay | Admitting: Family Medicine

## 2014-05-11 DIAGNOSIS — Z1231 Encounter for screening mammogram for malignant neoplasm of breast: Secondary | ICD-10-CM

## 2014-05-12 ENCOUNTER — Ambulatory Visit (HOSPITAL_COMMUNITY)
Admission: RE | Admit: 2014-05-12 | Discharge: 2014-05-12 | Disposition: A | Discharge status: Home / Self Care | Payer: BC Managed Care – PPO | Source: Ambulatory Visit | Attending: Family Medicine | Admitting: Family Medicine

## 2014-05-12 DIAGNOSIS — Z1231 Encounter for screening mammogram for malignant neoplasm of breast: Secondary | ICD-10-CM | POA: Insufficient documentation

## 2014-10-23 ENCOUNTER — Other Ambulatory Visit: Payer: Self-pay | Admitting: Physician Assistant

## 2014-10-23 ENCOUNTER — Ambulatory Visit
Admission: RE | Admit: 2014-10-23 | Discharge: 2014-10-23 | Disposition: A | Discharge status: Home / Self Care | Payer: BC Managed Care – PPO | Source: Ambulatory Visit | Attending: Physician Assistant | Admitting: Physician Assistant

## 2014-10-23 DIAGNOSIS — R2242 Localized swelling, mass and lump, left lower limb: Secondary | ICD-10-CM

## 2015-04-09 ENCOUNTER — Telehealth: Payer: Self-pay | Admitting: Internal Medicine

## 2015-04-09 NOTE — Telephone Encounter (Signed)
New Message ° ° ° ° ° ° ° °Pt calling to schedule ablation. Please call back and advise. °

## 2015-04-10 NOTE — Telephone Encounter (Signed)
F/u   Pt calling concerning scheduling ablation.

## 2015-04-10 NOTE — Telephone Encounter (Signed)
I spoke with the patient, as it has been a year since she has been seen she will need an office visit prior to any procedure.  She is aware and wanted to know if anyone else could see her to get the procedure done sooner.  I let her know I would discuss with Dr Gershon Craneamnitz's nurse tomorrow and call her back

## 2015-04-11 NOTE — Telephone Encounter (Signed)
Patient has been scheduled for Dr Elberta Fortisamnitz for 04/24/15 at 11:30am

## 2015-04-11 NOTE — Telephone Encounter (Signed)
Given to [place], EP scheduler, to call patient and arrange OV with Dr. Elberta Fortisamnitz to discuss SVT.

## 2015-04-23 NOTE — Progress Notes (Signed)
Electrophysiology Office Note   Date:  04/24/2015   ID:  TENECIA Murphy, DOB 16-Jun-1956, MRN [id number]  PCP:  Astrid Divine, MD  Cardiologist: Onnie Boer Primary Electrophysiologist: Will Jorja Loa, MD    Chief Complaint  Patient presents with  . SVT     History of Present Illness: Brittany Murphy is a 59 y.o. female who presents today for electrophysiology evaluation.   She has a history of SVT seen by Dr. Ladona Ridgel who suggested ablation.  Her ECG at that time showed NSR without pre-excitation.  She had an episode 2-3 weeks ago where she had palpitations, shortness of breath and fatigue.  This episode lasted 5 hours.  Most of her other episodes are more short lived than that one.  She says that she otherwise feels well.  She has tried vagal maneuvers which most of the time work for her.   Today, she denies symptoms of palpitations, chest pain, shortness of breath, orthopnea, PND, lower extremity edema, claudication, dizziness, presyncope, syncope, bleeding, or neurologic sequela. The patient is tolerating medications without difficulties and is otherwise without complaint today.    Past Medical History  Diagnosis Date  . Anemia   . Constipation   . SVT (supraventricular tachycardia) (HCC) 2012  . Palpitations    Past Surgical History  Procedure Laterality Date  . Cholecystectomy  2004  . Supracervical abdominal hysterectomy       Current Outpatient Prescriptions  Medication Sig Dispense Refill  . B Complex Vitamins (VITAMIN B COMPLEX PO) Take 1 tablet by mouth daily.    . Calcium Carb-Cholecalciferol (CALCIUM + D3) 600-200 MG-UNIT TABS Take 1 tablet by mouth daily.    . COPPER PO Take 2 mg by mouth daily.    . Multiple Vitamin (MULTIVITAMIN WITH MINERALS) TABS tablet Take 1 tablet by mouth daily.    Marland Kitchen OVER THE COUNTER MEDICATION VitaMelts - Vitamin C & Biotin - Take 1 capsule by mouth daily     No current facility-administered medications for this  visit.    Allergies:   Metoprolol   Social History:  The patient  reports that she has never smoked. She does not have any smokeless tobacco history on file. She reports that she does not drink alcohol or use illicit drugs.   Family History:  Per the patient, there are no health problems in her family.   ROS:  Please see the history of present illness.   Otherwise, review of systems is positive for palpitations.   All other systems are reviewed and negative.    PHYSICAL EXAM: VS:  BP 110/84 mmHg  Pulse 67  Ht  (1.626 m)  Wt 124 lb (56.246 kg)  BMI 21.27 kg/m2 , BMI Body mass index is 21.27 kg/(m^2). GEN: Well nourished, well developed, in no acute distress HEENT: normal Neck: no JVD, carotid bruits, or masses Cardiac: RRR; no murmurs, rubs, or gallops,no edema  Respiratory:  clear to auscultation bilaterally, normal work of breathing GI: soft, nontender, nondistended, + BS MS: no deformity or atrophy Skin: warm and dry Neuro:  Strength and sensation are intact Psych: euthymic mood, full affect  EKG:  EKG is ordered today. The ekg ordered today shows sinus rhythm, rate 67  Recent Labs: No results found for requested labs within last 365 days.    Lipid Panel     Component Value Date/Time   CHOL 133 03/11/2011 0545   TRIG 72 03/11/2011 0545   HDL 45 03/11/2011 0545   CHOLHDL  3.0 03/11/2011 0545   VLDL 14 03/11/2011 0545   LDLCALC 74 03/11/2011 0545     Wt Readings from Last 3 Encounters:  04/24/15 124 lb (56.246 kg)  04/13/14 125 lb 6.4 oz (56.881 kg)  03/20/14 123 lb (55.792 kg)     ASSESSMENT AND PLAN:  1.  SVT: Had episodes for the last few years with the most recent lasting up to 5 hours.  Has terminated in the past with vagal maneuvers.  Will plan on ablation.  Discussed risks and benefits of the procedure.  Risks include bleeding, tamponade and heart block.  She is nervous about the procedure but has agreed.  Will use CARTO for the procedure.  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  Current medicines are reviewed at length with the patient today.   The patient does not have concerns regarding her medicines.  The following changes were made today:  none  Labs/ tests ordered today include: CBC, BMP, SVT ablation with CARTO  Orders Placed This Encounter  Procedures  . EKG 12-Lead     Disposition:   FU with Will Camnitz post ablation  Signed, Will Jorja LoaMartin Camnitz, MD  04/24/2015 12:06 PM     [facility] [address] [place] [address] [phone] (office) [phone] (fax)

## 2015-04-24 ENCOUNTER — Encounter: Payer: Self-pay | Admitting: *Deleted

## 2015-04-24 ENCOUNTER — Ambulatory Visit (INDEPENDENT_AMBULATORY_CARE_PROVIDER_SITE_OTHER): Payer: BC Managed Care – PPO | Admitting: Cardiology

## 2015-04-24 ENCOUNTER — Encounter: Payer: Self-pay | Admitting: Cardiology

## 2015-04-24 VITALS — BP 110/84 | HR 67 | Ht 64.0 in | Wt 124.0 lb

## 2015-04-24 DIAGNOSIS — I471 Supraventricular tachycardia: Secondary | ICD-10-CM | POA: Diagnosis not present

## 2015-04-24 DIAGNOSIS — Z01812 Encounter for preprocedural laboratory examination: Secondary | ICD-10-CM | POA: Diagnosis not present

## 2015-04-24 LAB — CBC WITH DIFFERENTIAL/PLATELET
BASOS ABS: 0 10*3/uL (ref 0.0–0.1)
BASOS PCT: 0 % (ref 0–1)
EOS ABS: 0 10*3/uL (ref 0.0–0.7)
EOS PCT: 0 % (ref 0–5)
HCT: 36.6 % (ref 36.0–46.0)
Hemoglobin: 12.7 g/dL (ref 12.0–15.0)
LYMPHS ABS: 1.6 10*3/uL (ref 0.7–4.0)
Lymphocytes Relative: 24 % (ref 12–46)
MCH: 30.1 pg (ref 26.0–34.0)
MCHC: 34.7 g/dL (ref 30.0–36.0)
MCV: 86.7 fL (ref 78.0–100.0)
MPV: 11 fL (ref 8.6–12.4)
Monocytes Absolute: 0.6 10*3/uL (ref 0.1–1.0)
Monocytes Relative: 9 % (ref 3–12)
Neutro Abs: 4.5 10*3/uL (ref 1.7–7.7)
Neutrophils Relative %: 67 % (ref 43–77)
PLATELETS: 297 10*3/uL (ref 150–400)
RBC: 4.22 MIL/uL (ref 3.87–5.11)
RDW: 13.7 % (ref 11.5–15.5)
WBC: 6.7 10*3/uL (ref 4.0–10.5)

## 2015-04-24 LAB — BASIC METABOLIC PANEL
BUN: 13 mg/dL (ref 7–25)
CALCIUM: 9.7 mg/dL (ref 8.6–10.4)
CO2: 24 mmol/L (ref 20–31)
CREATININE: 0.76 mg/dL (ref 0.50–0.99)
Chloride: 103 mmol/L (ref 98–110)
Glucose, Bld: 100 mg/dL — ABNORMAL HIGH (ref 65–99)
Potassium: 4 mmol/L (ref 3.5–5.3)
Sodium: 137 mmol/L (ref 135–146)

## 2015-04-24 NOTE — Patient Instructions (Signed)
Medication Instructions:  Your physician recommends that you continue on your current medications as directed. Please refer to the Current Medication list given to you today.  Labwork: None ordered  Testing/Procedures: Your physician has recommended that you have an SVT ablation. Catheter ablation is a medical procedure used to treat some cardiac arrhythmias (irregular heartbeats). During catheter ablation, a long, thin, flexible tube is put into a blood vessel in your groin (upper thigh), or neck. This tube is called an ablation catheter. It is then guided to your heart through the blood vessel. Radio frequency waves destroy small areas of heart tissue where abnormal heartbeats may cause an arrhythmia to start. Please see the instruction sheet given to you today.   Follow-Up: Your physician recommends that you schedule a follow-up appointment in: 4 weeks with Amber/Renee for post ablation (SVT ablation on 11/7)  Your physician recommends that you schedule a follow-up appointment in: 3 months with Dr. Elberta Fortisamnitz post ablation (SVT ablation on 11/7)   Any Other Special Instructions Will Be Listed Below (If Applicable).  If you need a refill on your cardiac medications before your next appointment, please call your pharmacy.  Thank you for choosing [facility]!!   Dory HornSherri Stellah Donovan, RN [phone]

## 2015-04-24 NOTE — Addendum Note (Signed)
Addended by: Baird LyonsPRICE, Mechille Varghese L on: 04/24/2015 12:56 PM   Modules accepted: Orders

## 2015-04-30 ENCOUNTER — Encounter (HOSPITAL_COMMUNITY)
Admission: RE | Disposition: A | Discharge status: Home / Self Care | Payer: Self-pay | Source: Ambulatory Visit | Attending: Cardiology

## 2015-04-30 ENCOUNTER — Ambulatory Visit (HOSPITAL_COMMUNITY)
Admission: RE | Admit: 2015-04-30 | Discharge: 2015-04-30 | Disposition: A | Discharge status: Home / Self Care | Payer: BC Managed Care – PPO | Source: Ambulatory Visit | Attending: Cardiology | Admitting: Cardiology

## 2015-04-30 DIAGNOSIS — Z79899 Other long term (current) drug therapy: Secondary | ICD-10-CM | POA: Insufficient documentation

## 2015-04-30 DIAGNOSIS — I471 Supraventricular tachycardia: Secondary | ICD-10-CM | POA: Diagnosis present

## 2015-04-30 HISTORY — PX: ELECTROPHYSIOLOGIC STUDY: SHX172A

## 2015-04-30 SURGERY — A-FLUTTER/A-TACH/SVT ABLATION
Anesthesia: LOCAL

## 2015-04-30 MED ORDER — ONDANSETRON HCL 4 MG/2ML IJ SOLN: 4.0000 mg | Freq: Four times a day (QID) | INTRAMUSCULAR | Status: DC | PRN

## 2015-04-30 MED ORDER — HEPARIN (PORCINE) IN NACL 2-0.9 UNIT/ML-% IJ SOLN
INTRAMUSCULAR | Status: DC | PRN
  Administered 2015-04-30: 08:00:00

## 2015-04-30 MED ORDER — MIDAZOLAM HCL 5 MG/5ML IJ SOLN
INTRAMUSCULAR | Status: AC
  Filled 2015-04-30: qty 25

## 2015-04-30 MED ORDER — FENTANYL CITRATE (PF) 100 MCG/2ML IJ SOLN
INTRAMUSCULAR | Status: DC | PRN
  Administered 2015-04-30 (×3): 25 ug via INTRAVENOUS

## 2015-04-30 MED ORDER — BUPIVACAINE HCL (PF) 0.25 % IJ SOLN
INTRAMUSCULAR | Status: DC | PRN
  Administered 2015-04-30: 31 mL

## 2015-04-30 MED ORDER — SODIUM CHLORIDE 0.9 % IV SOLN: 250.0000 mL | INTRAVENOUS | Status: DC | PRN

## 2015-04-30 MED ORDER — HEPARIN (PORCINE) IN NACL 2-0.9 UNIT/ML-% IJ SOLN
INTRAMUSCULAR | Status: AC
  Filled 2015-04-30: qty 500

## 2015-04-30 MED ORDER — BUPIVACAINE HCL (PF) 0.25 % IJ SOLN
INTRAMUSCULAR | Status: AC
  Filled 2015-04-30: qty 60

## 2015-04-30 MED ORDER — SODIUM CHLORIDE 0.9 % IJ SOLN: 3.0000 mL | INTRAMUSCULAR | Status: DC | PRN

## 2015-04-30 MED ORDER — ISOPROTERENOL HCL 0.2 MG/ML IJ SOLN
INTRAMUSCULAR | Status: AC
  Filled 2015-04-30: qty 5

## 2015-04-30 MED ORDER — ISOPROTERENOL HCL 0.2 MG/ML IJ SOLN
1.0000 mg | INTRAVENOUS | Status: DC | PRN
  Administered 2015-04-30: 2 ug/min via INTRAVENOUS

## 2015-04-30 MED ORDER — SODIUM CHLORIDE 0.9 % IV SOLN
INTRAVENOUS | Status: DC
  Administered 2015-04-30: 06:00:00 via INTRAVENOUS

## 2015-04-30 MED ORDER — ACETAMINOPHEN 325 MG PO TABS
650.0000 mg | ORAL_TABLET | ORAL | Status: DC | PRN
  Filled 2015-04-30: qty 2

## 2015-04-30 MED ORDER — MIDAZOLAM HCL 5 MG/5ML IJ SOLN
INTRAMUSCULAR | Status: DC | PRN
  Administered 2015-04-30 (×4): 1 mg via INTRAVENOUS

## 2015-04-30 MED ORDER — FENTANYL CITRATE (PF) 100 MCG/2ML IJ SOLN
INTRAMUSCULAR | Status: AC
  Filled 2015-04-30: qty 4

## 2015-04-30 MED ORDER — SODIUM CHLORIDE 0.9 % IJ SOLN: 3.0000 mL | Freq: Two times a day (BID) | INTRAMUSCULAR | Status: DC

## 2015-04-30 SURGICAL SUPPLY — 11 items
BAG SNAP BAND KOVER 36X36 (MISCELLANEOUS) ×2 IMPLANT
CATH JOSEPHSON QUAD-ALLRED 6FR (CATHETERS) ×2 IMPLANT
CATH WEBSTER BI DIR CS D-F CRV (CATHETERS) ×2 IMPLANT
PACK EP LATEX FREE (CUSTOM PROCEDURE TRAY) ×2
PACK EP LF (CUSTOM PROCEDURE TRAY) ×1 IMPLANT
PAD DEFIB LIFELINK (PAD) ×2 IMPLANT
PATCH CARTO3 (PAD) ×2 IMPLANT
SHEATH PINNACLE 6F 10CM (SHEATH) ×3 IMPLANT
SHEATH PINNACLE 7F 10CM (SHEATH) ×2 IMPLANT
SHEATH PINNACLE 8F 10CM (SHEATH) ×2 IMPLANT
SHIELD RADPAD SCOOP 12X17 (MISCELLANEOUS) ×2 IMPLANT

## 2015-04-30 NOTE — Discharge Instructions (Signed)
Cardiac Ablation Cardiac ablation is a procedure to disable a small amount of heart tissue in very specific places. The heart has many electrical connections. Sometimes these connections are abnormal and can cause the heart to beat very fast or irregularly. By disabling some of the problem areas, heart rhythm can be improved or made normal. Ablation is done for people who:   Have Wolff-Parkinson-White syndrome.   Have other fast heart rhythms (tachycardia).   Have taken medicines for an abnormal heart rhythm (arrhythmia) that resulted in:   No success.   Side effects.   May have a high-risk heartbeat that could result in death.  LET [facility] CARE PROVIDER KNOW ABOUT:   Any allergies you have or any previous reactions you have had to X-ray dye, food (such as seafood), medicine, or tape.   All medicines you are taking, including vitamins, herbs, eye drops, creams, and over-the-counter medicines.   Previous problems you or members of your family have had with the use of anesthetics.   Any blood disorders you have.   Previous surgeries or procedures (such as a kidney transplant) you have had.   Medical conditions you have (such as kidney failure).  RISKS AND COMPLICATIONS Generally, cardiac ablation is a safe procedure. However, problems can occur and include:   Increased risk of cancer. Depending on how long it takes to do the ablation, the dose of radiation can be high.  Bruising and bleeding where a thin, flexible tube (catheter) was inserted during the procedure.   Bleeding into the chest, especially into the sac that surrounds the heart (serious).  Need for a permanent pacemaker if the normal electrical system is damaged.   The procedure may not be fully effective, and this may not be recognized for months. Repeat ablation procedures are sometimes required. BEFORE THE PROCEDURE   Follow any instructions from your health care provider regarding eating and  drinking before the procedure.   Take your medicines as directed at regular times with water, unless instructed otherwise by your health care provider. If you are taking diabetes medicine, including insulin, ask how you are to take it and if there are any special instructions you should follow. It is common to adjust insulin dosing the day of the ablation.  PROCEDURE  An ablation is usually performed in a catheterization laboratory with the guidance of fluoroscopy. Fluoroscopy is a type of X-ray that helps your health care provider see images of your heart during the procedure.   An ablation is a minimally invasive procedure. This means a small cut (incision) is made in either your neck or groin. Your health care provider will decide where to make the incision based on your medical history and physical exam.  An IV tube will be started before the procedure begins. You will be given an anesthetic or medicine to help you relax (sedative).  The skin on your neck or groin will be numbed. A needle will be inserted into a large vein in your neck or groin and catheters will be threaded to your heart.  A special dye that shows up on fluoroscopy pictures may be injected through the catheter. The dye helps your health care provider see the area of the heart that needs treatment.  The catheter has electrodes on the tip. When the area of heart tissue that is causing the arrhythmia is found, the catheter tip will send an electrical current to the area and "scar" the tissue. Three types of energy can be used to  ablate the heart tissue:   Heat (radiofrequency energy).   Laser energy.   Extreme cold (cryoablation).   When the area of the heart has been ablated, the catheter will be taken out. Pressure will be held on the insertion site. This will help the insertion site clot and keep it from bleeding. A bandage will be placed on the insertion site.  AFTER THE PROCEDURE   After the procedure, you  will be taken to a recovery area where your vital signs (blood pressure, heart rate, and breathing) will be monitored. The insertion site will also be monitored for bleeding.   You will need to lie still for 4-6 hours. This is to ensure you do not bleed from the catheter insertion site.    This information is not intended to replace advice given to you by your health care provider. Make sure you discuss any questions you have with your health care provider.   Document Released: 10/26/2008 Document Revised: 06/30/2014 Document Reviewed: 11/01/2012 Elsevier Interactive Patient Education 2016 [employer] Venogram, Care After Refer to this sheet in the next few weeks. These instructions provide you with information on caring for yourself after your procedure. Your health care provider may also give you more specific instructions. Your treatment has been planned according to current medical practices, but problems sometimes occur. Call your health care provider if you have any problems or questions after your procedure. WHAT TO EXPECT AFTER THE PROCEDURE After your procedure, it is typical to have the following sensations:  Mild discomfort at the catheter insertion site. HOME CARE INSTRUCTIONS   Take all medicines exactly as directed.  Follow any prescribed diet.  Follow instructions regarding both rest and physical activity.  Drink more fluids for the first several days after the procedure in order to help flush dye from your kidneys. SEEK MEDICAL CARE IF:  You develop a rash.  You have fever not controlled by medicine. SEEK IMMEDIATE MEDICAL CARE IF:  There is pain, drainage, bleeding, redness, swelling, warmth or a red streak at the site of the IV tube.  The extremity where your IV tube was placed becomes discolored, numb, or cool.  You have difficulty breathing or shortness of breath.  You develop chest pain.  You have excessive dizziness or fainting.   This information is  not intended to replace advice given to you by your health care provider. Make sure you discuss any questions you have with your health care provider.   Document Released: 03/30/2013 Document Revised: 06/14/2013 Document Reviewed: 03/30/2013 Elsevier Interactive Patient Education [employer] Elsevier Inc.

## 2015-04-30 NOTE — Progress Notes (Signed)
Bilateral femoral venous sheathes removed and pressure held to each groin for 15 minutes. Bilateral groins are level 0 with no oozing. Distal pulses intact and patient understands post cath instructions.

## 2015-04-30 NOTE — Progress Notes (Signed)
The patient is s/p EPS procedure, no inducible arrhythmias.   She has been seen and examined by Dr. Elberta Fortisamnitz, and felt stable for discharge.  Dr. Elberta Fortisamnitz discussed starting cardizem but the patient wished to hold off for now.   F/U with Dr. Elberta Fortisamnitz is arranged.  Francis Dowseenee Ursuy, PA-C  Negative EP study, no arrhythmia today.  Arneda Sappington have her follow up in clinic, has declined further medications.  Jaylenn Altier M. Meade Hogeland MD 04/30/2015 3:37 PM

## 2015-04-30 NOTE — H&P (Signed)
ID: Brittany Murphy, DOB Mar 02, 1956, MRN [id number]  PCP: Astrid Divine, Brittany Murphy Cardiologist: Onnie Boer Primary Electrophysiologist: Wilder Amodei Brittany Loa, Brittany Murphy   Chief Complaint  Patient presents with  . SVT    History of Present Illness: Brittany Murphy is a 59 y.o. female who presents today for electrophysiology evaluation. She has a history of SVT seen by Dr. Ladona Ridgel who suggested ablation. Her ECG at that time showed NSR without pre-excitation. She had an episode 2-3 weeks ago where she had palpitations, shortness of breath and fatigue. This episode lasted 5 hours. Most of her other episodes are more short lived than that one. She says that she otherwise feels well. She has tried vagal maneuvers which most of the time work for her.   Today, she denies symptoms of palpitations, chest pain, shortness of breath, orthopnea, PND, lower extremity edema, claudication, dizziness, presyncope, syncope, bleeding, or neurologic sequela. The patient is tolerating medications without difficulties and is otherwise without complaint today.    Past Medical History  Diagnosis Date  . Anemia   . Constipation   . SVT (supraventricular tachycardia) (HCC) 2012  . Palpitations    Past Surgical History  Procedure Laterality Date  . Cholecystectomy  2004  . Supracervical abdominal hysterectomy       Current Outpatient Prescriptions  Medication Sig Dispense Refill  . B Complex Vitamins (VITAMIN B COMPLEX PO) Take 1 tablet by mouth daily.    . Calcium Carb-Cholecalciferol (CALCIUM + D3) 600-200 MG-UNIT TABS Take 1 tablet by mouth daily.    . COPPER PO Take 2 mg by mouth daily.    . Multiple Vitamin (MULTIVITAMIN WITH MINERALS) TABS tablet Take 1 tablet by mouth daily.    Marland Kitchen OVER THE COUNTER MEDICATION VitaMelts - Vitamin C & Biotin - Take 1 capsule by mouth daily     No current facility-administered  medications for this visit.    Allergies: Metoprolol   Social History: The patient  reports that she has never smoked. She does not have any smokeless tobacco history on file. She reports that she does not drink alcohol or use illicit drugs.   Family History: Per the patient, there are no health problems in her family.   ROS: Please see the history of present illness. Otherwise, review of systems is positive for palpitations. All other systems are reviewed and negative.    PHYSICAL EXAM: VS: BP 110/84 mmHg  Pulse 67  Ht  (1.626 m)  Wt 124 lb (56.246 kg)  BMI 21.27 kg/m2 , BMI Body mass index is 21.27 kg/(m^2). GEN: Well nourished, well developed, in no acute distress  HEENT: normal  Neck: no JVD, carotid bruits, or masses Cardiac: RRR; no murmurs, rubs, or gallops,no edema  Respiratory: clear to auscultation bilaterally, normal work of breathing GI: soft, nontender, nondistended, + BS MS: no deformity or atrophy  Skin: warm and dry Neuro: Strength and sensation are intact Psych: euthymic mood, full affect  EKG: EKG is ordered today. The ekg ordered today shows sinus rhythm, rate 67  Recent Labs: No results found for requested labs within last 365 days.    Lipid Panel   Labs (Brief)       Component Value Date/Time   CHOL 133 03/11/2011 0545   TRIG 72 03/11/2011 0545   HDL 45 03/11/2011 0545   CHOLHDL 3.0 03/11/2011 0545   VLDL 14 03/11/2011 0545   LDLCALC 74 03/11/2011 0545       Wt Readings from Last 3 Encounters:  04/24/15 124 lb (56.246 kg)  04/13/14 125 lb 6.4 oz (56.881 kg)  03/20/14 123 lb (55.792 kg)     ASSESSMENT AND PLAN:  1. SVT: Had episodes for the last few years with the most recent lasting up to 5 hours. Has terminated in the past with vagal maneuvers. Philander Ake plan on ablation. Discussed risks and benefits of the procedure. Risks include bleeding, tamponade and heart block. She is  nervous about the procedure but has agreed. Siham Bucaro use CARTO for the procedure. 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  Current medicines are reviewed at length with the patient today.  The patient does not have concerns regarding her medicines. The following changes were made today: none  Labs/ tests ordered today include: CBC, BMP, SVT ablation with CARTO  Orders Placed This Encounter  Procedures  . EKG 12-Lead     Disposition: FU with Syrita Dovel post ablation  Signed, Brittany Riecke Brittany LoaMartin Eligha Kmetz, Brittany Murphy  04/24/2015 12:06 PM         Seen and examined Brittany Murphy today pre procedure.  Patient with documented SVT presenting for ablation.  Regular rhythm with clear lungs today.  Explained the risks and benefits of the procedure including bleeding, infection, tamponade, stroke, and heart block.  The patient understands the risks and has agreed to proceed with the procedure.

## 2015-05-01 ENCOUNTER — Encounter (HOSPITAL_COMMUNITY): Payer: Self-pay | Admitting: Cardiology

## 2015-05-22 ENCOUNTER — Encounter: Payer: Self-pay | Admitting: Internal Medicine

## 2015-05-31 ENCOUNTER — Other Ambulatory Visit: Payer: Self-pay

## 2015-05-31 DIAGNOSIS — Z1231 Encounter for screening mammogram for malignant neoplasm of breast: Secondary | ICD-10-CM

## 2015-06-01 ENCOUNTER — Ambulatory Visit
Admission: RE | Admit: 2015-06-01 | Discharge: 2015-06-01 | Disposition: A | Discharge status: Home / Self Care | Payer: BC Managed Care – PPO | Source: Ambulatory Visit

## 2015-06-01 DIAGNOSIS — Z1231 Encounter for screening mammogram for malignant neoplasm of breast: Secondary | ICD-10-CM

## 2015-06-05 ENCOUNTER — Other Ambulatory Visit: Payer: Self-pay | Admitting: Family Medicine

## 2015-06-05 DIAGNOSIS — R928 Other abnormal and inconclusive findings on diagnostic imaging of breast: Secondary | ICD-10-CM

## 2015-06-07 ENCOUNTER — Ambulatory Visit (INDEPENDENT_AMBULATORY_CARE_PROVIDER_SITE_OTHER): Payer: BC Managed Care – PPO | Admitting: Physician Assistant

## 2015-06-07 ENCOUNTER — Ambulatory Visit: Payer: BC Managed Care – PPO | Admitting: Cardiology

## 2015-06-07 ENCOUNTER — Encounter: Payer: Self-pay | Admitting: Physician Assistant

## 2015-06-07 VITALS — BP 124/78 | HR 83 | Ht 64.0 in | Wt 124.0 lb

## 2015-06-07 DIAGNOSIS — I471 Supraventricular tachycardia: Secondary | ICD-10-CM

## 2015-06-07 NOTE — Progress Notes (Addendum)
Cardiology Office Note Date:  06/07/2015  Patient ID:  Brittany MeekerBessie N Murphy, DOB 29-Oct-1955, MRN [id number] PCP:  Astrid DivineGRIFFIN,ELAINE COLLINS, MD  Electrophysiologist: Dr. Elberta Fortisamnitz (Dr. Ladona Ridgelaylor previously)   Chief Complaint: s/p EPS with Dr. Elberta Fortisamnitz 04/30/15  History of Present Illness: Brittany Murphy is a 59 y.o. female with history of SVT.  She comes in post EPS, unfortunately unable to induce her arrhythmia at the time of her procedure.  Since then she reports her procedure sites healed very well and quickly without and pain, bleeding, swelling or complications.  Though she has had 2 more episodes of palpitations, one lasting about 20 minutes, and the last about 5 minutes, they are anxiety provoking, make her feel very weak with some change in her vision but no syncope.  She would like to try to do the procedure again.  She had not been able to identify any particular trigger, she denies any significant caffeine intake, and reports keeping well hydrated. Tend to occur at rest when relaxed, the last when bending to pick up a water bottle from the floor.  She mentions R foot/arch discomfort.  She denies any kind of CP or SOB.     Past Medical History  Diagnosis Date  . Anemia   . Constipation   . SVT (supraventricular tachycardia) (HCC) 2012  . Palpitations     Past Surgical History  Procedure Laterality Date  . Cholecystectomy  2004  . Supracervical abdominal hysterectomy    . Electrophysiologic study N/A 04/30/2015    Procedure: SVT Ablation;  Surgeon: Will Jorja LoaMartin Camnitz, MD;  Location: [facility];  Service: Cardiovascular;  Laterality: N/A;    Current Outpatient Prescriptions  Medication Sig Dispense Refill  . B Complex Vitamins (VITAMIN B COMPLEX PO) Take 1 tablet by mouth daily.    . Multiple Vitamin (MULTIVITAMIN WITH MINERALS) TABS tablet Take 1 tablet by mouth daily.    Marland Kitchen. OVER THE COUNTER MEDICATION VitaMelts - Vitamin C & Biotin - Take 1 capsule by mouth daily     No  current facility-administered medications for this visit.    Allergies:   Metoprolol   Social History:  The patient  reports that she has never smoked. She does not have any smokeless tobacco history on file. She reports that she does not drink alcohol or use illicit drugs.   Family History:  The patient denies any family medical history.  Reports them to be/have been healthy, her father died when she was young, denies he had known health issues.  ROS:  Please see the history of present illness. .   All other systems are reviewed and otherwise negative.   PHYSICAL EXAM:  VS:  BP 124/78 mmHg  Pulse 83  Ht 5\' 4"  (1.626 m)  Wt 124 lb (56.246 kg)  BMI 21.27 kg/m2 BMI: Body mass index is 21.27 kg/(m^2). Well nourished, well developed, in no acute distress HEENT: normocephalic, atraumatic Neck: no JVD, carotid bruits or masses Cardiac:  normal S1, S2; RRR; no significant murmurs, no rubs, or gallops Lungs:  clear to auscultation bilaterally, no wheezing, rhonchi or rales Abd: soft, nontender MS: no deformity or atrophy Ext: no edema, 3+ bounding pedal pulses R, no skin changes, swelling Skin: warm and dry, no rash Neuro:  No gross deficits appreciated Psych: euthymic mood, full affect   EKG:  Today is SR, nonspecific T changes Done 04/24/15 showed SR, nonspecific ST/T changes  04/30/15: PREPROCEDURE DIAGNOSIS: SVT  POSTPROCEDURE DIAGNOSIS: SVT PROCEDURES:  1. Comprehensive  EP study.  2. Coronary sinus pacing and recording.  CONCLUSIONS:  1. Sinus rhythm upon presentation.  2. No inducible arrhythmias 3. No early apparent complications.   03/12/11: Echocardiogram LVEF 55-60%   Recent Labs: 04/24/2015: BUN 13; Creat 0.76; Hemoglobin 12.7; Platelets 297; Potassium 4.0; Sodium 137  No results found for requested labs within last 365 days.     Wt Readings from Last 3 Encounters:  06/07/15 124 lb (56.246 kg)  04/30/15 124 lb (56.246 kg)  04/24/15 124 lb (56.246 kg)      Other studies reviewed: Additional studies/records reviewed today include: summarized above  ASSESSMENT AND PLAN:  1. SVT     Palpitations     ER visit  03/20/14 with SVT that converted with  adenosine, that ER note mentions similar events 3 years prior to that and intolerant of low dose metoprolol and other notes mention cardizem was ineffective in controlling her palpitations     Unable to induce arrhythmia at her EPS     She would like to re-try the EPS procedure, Dr. Elberta Fortis spoke with her again today as well answered her procedural questions she had, discussed possibly using less sedation in hopes to be able to induce her SVT, she would like to try and get it done prior to the end of the year for financial reasons and her deductable, at this time, no available times, though staff will contact her if something opens up. We will schedule her a f/u for 6 weeks from now, potentially for a post procedure visit, sooner of needed for any recurrent symptoms.    She is cautioned regarding driving, she has not had syncope, if she feels any symptoms to pull over immediately, given as of now she seems to have warning/able to recognize them prior to becoming significant, and if needed to avoid driving.  To seek medical attention if needed for recurrent tachycardia/symptoms, not self resolved.      Dr. Elberta Fortis does not feel R arch pain is procedure related    She is counseled to keep adequately hydrated, avoid caffeine/stimulatnts, when bending to do so at the knees.   She reports medicines have not worked for her and would like to avoid medications, want to try the EP again, Dr. Elberta Fortis mentioned post visit though that if needed, we may try Flecainide if recurrent tachycardia.                                                                  Disposition: F/u with EP 6 weeks    Judith Blonder, PA-C 06/07/2015 11:23 AM     [facility] [address] [place]  [address] [phone] (office)  [phone] (fax)

## 2015-06-07 NOTE — Patient Instructions (Addendum)
Medication Instructions:  NONE ORDER TODAY     If you need a refill on your cardiac medications before your next appointment, please call your pharmacy.  Labwork: NONE ORDER TODAY    Testing/Procedures: NONE ORDER TODAY    Follow-Up  6 WEEK FOLLOW UP WITH  AN AVILABLE APP FOR  SVT.Marland Kitchen.   Any Other Special Instructions Will Be Listed Below (If Applicable).

## 2015-06-08 ENCOUNTER — Telehealth: Payer: Self-pay | Admitting: Cardiology

## 2015-06-08 NOTE — Telephone Encounter (Signed)
Follow Up  Pt called to follow Up on receiving the confirmation of dates for the ablation please call

## 2015-06-08 NOTE — Telephone Encounter (Signed)
Informed patient that we do not having any procedure openings for the rest of this year. (Needs another EPS w/ possible repeat ablation) I will continue to monitor for cancellations as she would like to go before the end of the year. She thanks me for helping.

## 2015-06-11 ENCOUNTER — Ambulatory Visit
Admission: RE | Admit: 2015-06-11 | Discharge: 2015-06-11 | Disposition: A | Discharge status: Home / Self Care | Payer: BC Managed Care – PPO | Source: Ambulatory Visit | Attending: Family Medicine | Admitting: Family Medicine

## 2015-06-11 DIAGNOSIS — R928 Other abnormal and inconclusive findings on diagnostic imaging of breast: Secondary | ICD-10-CM

## 2015-07-31 NOTE — Progress Notes (Signed)
error    This encounter was created in error - please disregard.

## 2015-08-01 ENCOUNTER — Encounter: Payer: BC Managed Care – PPO | Admitting: Cardiology

## 2015-08-03 ENCOUNTER — Encounter: Payer: Self-pay | Admitting: Cardiology

## 2016-05-13 ENCOUNTER — Other Ambulatory Visit: Payer: Self-pay | Admitting: Family Medicine

## 2016-05-13 ENCOUNTER — Other Ambulatory Visit (HOSPITAL_COMMUNITY)
Admission: RE | Admit: 2016-05-13 | Discharge: 2016-05-13 | Disposition: A | Discharge status: Home / Self Care | Payer: BC Managed Care – PPO | Source: Ambulatory Visit | Attending: Family Medicine | Admitting: Family Medicine

## 2016-05-13 DIAGNOSIS — Z124 Encounter for screening for malignant neoplasm of cervix: Secondary | ICD-10-CM | POA: Diagnosis present

## 2016-05-16 LAB — CYTOLOGY - PAP: Diagnosis: NEGATIVE

## 2017-05-26 ENCOUNTER — Other Ambulatory Visit: Payer: Self-pay | Admitting: Family Medicine

## 2017-05-26 DIAGNOSIS — R2242 Localized swelling, mass and lump, left lower limb: Secondary | ICD-10-CM

## 2017-05-26 NOTE — Progress Notes (Signed)
Electrophysiology Office Note   Date:  05/27/2017   ID:  Brittany Murphy, DOB Oct 30, 1955, MRN [id number]  PCP:  Maurice SmallGriffin, Elaine, MD  Cardiologist: Onnie BoerGregg Tayolr Primary Electrophysiologist: Arjan Strohm Jorja LoaMartin Leonte Horrigan, MD    Chief Complaint  Patient presents with  . Follow-up    SVT     History of Present Illness: Brittany Murphy is a 61 y.o. female who presents today for electrophysiology evaluation.   She has a history of SVT seen by Dr. Ladona Ridgelaylor who suggested ablation.  Her ECG at that time showed NSR without pre-excitation.  She had a negative EP study on 04/30/15.   Today, denies symptoms of chest pain, shortness of breath, orthopnea, PND, lower extremity edema, claudication, dizziness, presyncope, syncope, bleeding, or neurologic sequela. The patient is tolerating medications without difficulties.  She has continued to have palpitations.  They occur a few times a year.  The most recent occurred in August and lasted up to 6 hours.  She has tried vagal maneuvers without any help.  She has had side effects from rate controlling medications.  Her symptoms have kept her from living her normal life as she has been quite anxious.   Past Medical History:  Diagnosis Date  . Anemia   . Anxiety 04/13/2014  . Constipation   . Palpitations   . SVT (supraventricular tachycardia) (HCC) 2012   Past Surgical History:  Procedure Laterality Date  . CHOLECYSTECTOMY  2004  . ELECTROPHYSIOLOGIC STUDY N/A 04/30/2015   Procedure: SVT Ablation;  Surgeon: Vane Yapp Jorja LoaMartin Jhett Fretwell, MD;  Location: [facility];  Service: Cardiovascular;  Laterality: N/A;  . SUPRACERVICAL ABDOMINAL HYSTERECTOMY       Current Outpatient Medications  Medication Sig Dispense Refill  . B Complex Vitamins (VITAMIN B COMPLEX PO) Take 1 tablet by mouth daily.    . Biotin 1000 MCG tablet Take 1,000 mcg by mouth daily.    . Multiple Vitamin (MULTIVITAMIN WITH MINERALS) TABS tablet Take 1 tablet by mouth daily.    Marland Kitchen. OVER THE  COUNTER MEDICATION VitaMelts - Vitamin C & Biotin - Take 1 capsule by mouth daily    . Vitamin D, Ergocalciferol, (DRISDOL) 50000 units CAPS capsule Take 50,000 Units by mouth once a week. On Saturday     No current facility-administered medications for this visit.     Allergies:   Metoprolol   Social History:  The patient  reports that  has never smoked. she has never used smokeless tobacco. She reports that she does not drink alcohol or use drugs.   Family History:  Per the patient, there are no health problems in her family.   ROS:  Please see the history of present illness.   Otherwise, review of systems is positive for leg swelling, palpitations, chest pressure.   All other systems are reviewed and negative.   PHYSICAL EXAM: VS:  BP (!) 144/88   Pulse 79   Ht 5\' 3"  (1.6 m)   Wt 125 lb (56.7 kg)   BMI 22.14 kg/m  , BMI Body mass index is 22.14 kg/m. GEN: Well nourished, well developed, in no acute distress  HEENT: normal  Neck: no JVD, carotid bruits, or masses Cardiac: RRR; no murmurs, rubs, or gallops,no edema  Respiratory:  clear to auscultation bilaterally, normal work of breathing GI: soft, nontender, nondistended, + BS MS: no deformity or atrophy  Skin: warm and dry Neuro:  Strength and sensation are intact Psych: euthymic mood, full affect  EKG:  EKG is ordered  today. Personal review of the ekg ordered shows sinus rhythm   Recent Labs: No results found for requested labs within last 8760 hours.    Lipid Panel     Component Value Date/Time   CHOL 133 03/11/2011 0545   TRIG 72 03/11/2011 0545   HDL 45 03/11/2011 0545   CHOLHDL 3.0 03/11/2011 0545   VLDL 14 03/11/2011 0545   LDLCALC 74 03/11/2011 0545     Wt Readings from Last 3 Encounters:  05/27/17 125 lb (56.7 kg)  06/07/15 124 lb (56.2 kg)  04/30/15 124 lb (56.2 kg)     ASSESSMENT AND PLAN:  1.  SVT: SVT previously with a negative EP study on 04/30/15.  She is continued to have episodes of  palpitations a few times a year.  The palpitations and symptoms have kept her from living her normal life as she is worried about SVT.  She would like to have another attempt at ablation.  Told her that if we are able to get her arrhythmia started, she has up to a 90% chance of cure.  Risks and benefits include bleeding, tamponade, heart block, and stroke among others.  She understands these risks and has agreed to the procedure.  Current medicines are reviewed at length with the patient today.   The patient does not have concerns regarding her medicines.  The following changes were made today:  none  Labs/ tests ordered today include:  Orders Placed This Encounter  Procedures  . EKG 12-Lead     Disposition:   FU with Leightyn Cina post ablation  Signed, Alajiah Dutkiewicz Jorja LoaMartin Charliee Krenz, MD  05/27/2017 9:08 AM     [facility] [address] [place] [address] [phone] (office) [phone] (fax)

## 2017-05-27 ENCOUNTER — Ambulatory Visit: Payer: BC Managed Care – PPO | Admitting: Cardiology

## 2017-05-27 ENCOUNTER — Encounter: Payer: Self-pay | Admitting: Cardiology

## 2017-05-27 ENCOUNTER — Encounter: Payer: Self-pay | Admitting: *Deleted

## 2017-05-27 ENCOUNTER — Encounter (INDEPENDENT_AMBULATORY_CARE_PROVIDER_SITE_OTHER): Payer: Self-pay

## 2017-05-27 ENCOUNTER — Other Ambulatory Visit: Payer: Self-pay | Admitting: Cardiology

## 2017-05-27 VITALS — BP 144/88 | HR 79 | Ht 63.0 in | Wt 125.0 lb

## 2017-05-27 DIAGNOSIS — I471 Supraventricular tachycardia: Secondary | ICD-10-CM

## 2017-05-27 NOTE — Patient Instructions (Signed)
Medication Instructions:  Your physician recommends that you continue on your current medications as directed. Please refer to the Current Medication list given to you today.  * If you need a refill on your cardiac medications before your next appointment, please call your pharmacy. *  Labwork: None ordered  Testing/Procedures: Your physician has recommended that you have a repeat SVT ablation. Catheter ablation is a medical procedure used to treat some cardiac arrhythmias (irregular heartbeats). During catheter ablation, a long, thin, flexible tube is put into a blood vessel in your groin (upper thigh), or neck. This tube is called an ablation catheter. It is then guided to your heart through the blood vessel. Radio frequency waves destroy small areas of heart tissue where abnormal heartbeats may cause an arrhythmia to start. Please see the instruction sheet given to you today.  Follow-Up: Your physician recommends that you schedule a follow-up appointment between February 7 - February 20th 2019 with Dr. Elberta Fortisamnitz. (this will be for history & physical and pre procedure lab work prior to your procedure)  Thank you for choosing [facility]!!   Dory HornSherri Price, RN [phone]

## 2017-06-11 ENCOUNTER — Ambulatory Visit
Admission: RE | Admit: 2017-06-11 | Discharge: 2017-06-11 | Disposition: A | Discharge status: Home / Self Care | Payer: BC Managed Care – PPO | Source: Ambulatory Visit | Attending: Family Medicine | Admitting: Family Medicine

## 2017-06-11 DIAGNOSIS — R2242 Localized swelling, mass and lump, left lower limb: Secondary | ICD-10-CM

## 2017-06-11 MED ORDER — GADOBENATE DIMEGLUMINE 529 MG/ML IV SOLN
10.0000 mL | Freq: Once | INTRAVENOUS | Status: AC | PRN
  Administered 2017-06-11: 10 mL via INTRAVENOUS

## 2017-07-28 ENCOUNTER — Encounter: Payer: Self-pay | Admitting: Cardiology

## 2017-08-04 ENCOUNTER — Encounter: Payer: Self-pay | Admitting: Cardiology

## 2017-08-04 ENCOUNTER — Encounter: Payer: Self-pay | Admitting: *Deleted

## 2017-08-04 ENCOUNTER — Ambulatory Visit (INDEPENDENT_AMBULATORY_CARE_PROVIDER_SITE_OTHER): Payer: BC Managed Care – PPO | Admitting: Cardiology

## 2017-08-04 VITALS — BP 120/82 | HR 88 | Ht 63.0 in | Wt 125.0 lb

## 2017-08-04 DIAGNOSIS — I471 Supraventricular tachycardia: Secondary | ICD-10-CM

## 2017-08-04 DIAGNOSIS — Z01812 Encounter for preprocedural laboratory examination: Secondary | ICD-10-CM

## 2017-08-04 NOTE — Progress Notes (Signed)
Electrophysiology Office Note   Date:  08/04/2017   ID:  Brittany Murphy, DOB 1955/10/31, MRN [id number]  PCP:  Maurice SmallGriffin, Elaine, MD  Cardiologist: Onnie BoerGregg Tayolr Primary Electrophysiologist: Jaykwon Morones Jorja LoaMartin Ronin Rehfeldt, MD    Chief Complaint  Patient presents with  . Follow-up    H&P/Pre labs/SVT     History of Present Illness: Brittany Murphy is a 62 y.o. female who presents today for electrophysiology evaluation.   She has a history of SVT seen by Dr. Ladona Ridgelaylor who suggested ablation.  Her ECG at that time showed NSR without pre-excitation.  She had a negative EP study on 04/30/15.   Today, denies symptoms of chest pain, shortness of breath, orthopnea, PND, lower extremity edema, claudication, dizziness, presyncope, syncope, bleeding, or neurologic sequela. The patient is tolerating medications without difficulties.  Currently she is feeling well without major complaint.  She does have palpitations occasionally.  These are short-lived.   Past Medical History:  Diagnosis Date  . Anemia   . Anxiety 04/13/2014  . Constipation   . Palpitations   . SVT (supraventricular tachycardia) (HCC) 2012   Past Surgical History:  Procedure Laterality Date  . CHOLECYSTECTOMY  2004  . ELECTROPHYSIOLOGIC STUDY N/A 04/30/2015   Procedure: SVT Ablation;  Surgeon: Bilal Manzer Jorja LoaMartin Ajai Harville, MD;  Location: [facility];  Service: Cardiovascular;  Laterality: N/A;  . SUPRACERVICAL ABDOMINAL HYSTERECTOMY       Current Outpatient Medications  Medication Sig Dispense Refill  . B Complex Vitamins (VITAMIN B COMPLEX PO) Take 1 tablet by mouth daily.    . Biotin 1000 MCG tablet Take 1,000 mcg by mouth daily.    . Multiple Vitamin (MULTIVITAMIN WITH MINERALS) TABS tablet Take 1 tablet by mouth daily.    Marland Kitchen. OVER THE COUNTER MEDICATION VitaMelts - Vitamin C & Biotin - Take 1 capsule by mouth daily     No current facility-administered medications for this visit.     Allergies:   Metoprolol   Social History:   The patient  reports that  has never smoked. she has never used smokeless tobacco. She reports that she does not drink alcohol or use drugs.   Family History:  Per the patient, there are no health problems in her family.  ROS:  Please see the history of present illness.   Otherwise, review of systems is positive for palpitations.   All other systems are reviewed and negative.   PHYSICAL EXAM: VS:  BP 120/82   Pulse 88   Ht 5\' 3"  (1.6 m)   Wt 125 lb (56.7 kg)   SpO2 99%   BMI 22.14 kg/m  , BMI Body mass index is 22.14 kg/m. GEN: Well nourished, well developed, in no acute distress  HEENT: normal  Neck: no JVD, carotid bruits, or masses Cardiac: RRR; no murmurs, rubs, or gallops,no edema  Respiratory:  clear to auscultation bilaterally, normal work of breathing GI: soft, nontender, nondistended, + BS MS: no deformity or atrophy  Skin: warm and dry Neuro:  Strength and sensation are intact Psych: euthymic mood, full affect  EKG:  EKG is not ordered today. Personal review of the ekg ordered 05/27/17 shows sinus rhythm   Recent Labs: No results found for requested labs within last 8760 hours.    Lipid Panel     Component Value Date/Time   CHOL 133 03/11/2011 0545   TRIG 72 03/11/2011 0545   HDL 45 03/11/2011 0545   CHOLHDL 3.0 03/11/2011 0545   VLDL 14 03/11/2011 0545  LDLCALC 74 03/11/2011 0545     Wt Readings from Last 3 Encounters:  08/04/17 125 lb (56.7 kg)  05/27/17 125 lb (56.7 kg)  06/07/15 124 lb (56.2 kg)     ASSESSMENT AND PLAN:  1.  SVT: Negative study 04/30/15.  He did not have episodes of palpitations.  A repeat EP study with possible ablation on 08/14/17.  Risks and benefits discussed.  Risks include bleeding, tamponade, heart block, and stroke.  These risks and agreed to the procedure.  She did have a biopsy of a mass on her left thigh.  She had that mass to be an issue, she may wish to cancel the procedure.  Current medicines are reviewed at length with  the patient today.   The patient does not have concerns regarding her medicines.  The following changes were made today: None  Labs/ tests ordered today include:  Orders Placed This Encounter  Procedures  . Basic Metabolic Panel (BMET)     Disposition:   FU with Tanish Prien 1 month  Signed, Cerria Randhawa Jorja Loa, MD  08/04/2017 12:23 PM     [facility] [address] [place] [address] [phone] (office) [phone] (fax)

## 2017-08-04 NOTE — Patient Instructions (Addendum)
Medication Instructions:  Your physician recommends that you continue on your current medications as directed. Please refer to the Current Medication list given to you today.  * If you need a refill on your cardiac medications before your next appointment, please call your pharmacy. *  Labwork: Pre procedure labs: BMET  Testing/Procedures: None ordered  Follow-Up: Your physician recommends that you schedule a follow-up appointment in: 4 weeks, after your procedure on 08/14/17, with Dr. Elberta Fortisamnitz  Thank you for choosing [facility]!!   Dory HornSherri Jahaan Vanwagner, RN [phone]

## 2017-08-05 LAB — BASIC METABOLIC PANEL
BUN/Creatinine Ratio: 19 (ref 12–28)
BUN: 14 mg/dL (ref 8–27)
CHLORIDE: 102 mmol/L (ref 96–106)
CO2: 21 mmol/L (ref 20–29)
Calcium: 9.6 mg/dL (ref 8.7–10.3)
Creatinine, Ser: 0.73 mg/dL (ref 0.57–1.00)
GFR calc Af Amer: 102 mL/min/{1.73_m2} (ref >59)
GFR calc non Af Amer: 89 mL/min/{1.73_m2} (ref >59)
GLUCOSE: 132 mg/dL — AB (ref 65–99)
POTASSIUM: 4.4 mmol/L (ref 3.5–5.2)
SODIUM: 142 mmol/L (ref 134–144)

## 2017-08-14 ENCOUNTER — Encounter (HOSPITAL_COMMUNITY): Payer: Self-pay

## 2017-08-14 ENCOUNTER — Ambulatory Visit (HOSPITAL_COMMUNITY)
Admission: RE | Disposition: A | Discharge status: Home / Self Care | Payer: Self-pay | Source: Ambulatory Visit | Attending: Cardiology

## 2017-08-14 ENCOUNTER — Ambulatory Visit (HOSPITAL_COMMUNITY)
Admission: RE | Admit: 2017-08-14 | Discharge: 2017-08-14 | Disposition: A | Discharge status: Home / Self Care | Payer: BC Managed Care – PPO | Source: Ambulatory Visit | Attending: Cardiology | Admitting: Cardiology

## 2017-08-14 DIAGNOSIS — I471 Supraventricular tachycardia: Secondary | ICD-10-CM | POA: Diagnosis not present

## 2017-08-14 HISTORY — PX: SVT ABLATION: EP1225

## 2017-08-14 LAB — CBC
HEMATOCRIT: 36.8 % (ref 36.0–46.0)
HEMOGLOBIN: 12.2 g/dL (ref 12.0–15.0)
MCH: 29.7 pg (ref 26.0–34.0)
MCHC: 33.2 g/dL (ref 30.0–36.0)
MCV: 89.5 fL (ref 78.0–100.0)
Platelets: 258 10*3/uL (ref 150–400)
RBC: 4.11 MIL/uL (ref 3.87–5.11)
RDW: 13.2 % (ref 11.5–15.5)
WBC: 6.2 10*3/uL (ref 4.0–10.5)

## 2017-08-14 SURGERY — SVT ABLATION

## 2017-08-14 MED ORDER — MIDAZOLAM HCL 5 MG/5ML IJ SOLN
INTRAMUSCULAR | Status: AC
  Filled 2017-08-14: qty 5

## 2017-08-14 MED ORDER — ONDANSETRON HCL 4 MG/2ML IJ SOLN
4.0000 mg | Freq: Four times a day (QID) | INTRAMUSCULAR | Status: DC | PRN
Start: 2017-08-14 — End: 2017-08-14

## 2017-08-14 MED ORDER — FENTANYL CITRATE (PF) 100 MCG/2ML IJ SOLN
INTRAMUSCULAR | Status: DC | PRN
  Administered 2017-08-14 (×4): 25 ug via INTRAVENOUS

## 2017-08-14 MED ORDER — FENTANYL CITRATE (PF) 100 MCG/2ML IJ SOLN
INTRAMUSCULAR | Status: AC
  Filled 2017-08-14: qty 2

## 2017-08-14 MED ORDER — BUPIVACAINE HCL (PF) 0.25 % IJ SOLN
INTRAMUSCULAR | Status: DC | PRN
  Administered 2017-08-14: 60 mL

## 2017-08-14 MED ORDER — SODIUM CHLORIDE 0.9% FLUSH: 3.0000 mL | INTRAVENOUS | Status: DC | PRN

## 2017-08-14 MED ORDER — SODIUM CHLORIDE 0.9 % IV SOLN: 250.0000 mL | INTRAVENOUS | Status: DC | PRN

## 2017-08-14 MED ORDER — ISOPROTERENOL HCL 0.2 MG/ML IJ SOLN
INTRAMUSCULAR | Status: AC
  Filled 2017-08-14: qty 5

## 2017-08-14 MED ORDER — ADULT MULTIVITAMIN W/MINERALS CH: 1.0000 | ORAL_TABLET | Freq: Every day | ORAL | Status: DC

## 2017-08-14 MED ORDER — SODIUM CHLORIDE 0.9 % IV SOLN
INTRAVENOUS | Status: DC | PRN
  Administered 2017-08-14: 2 ug/min via INTRAVENOUS

## 2017-08-14 MED ORDER — SODIUM CHLORIDE 0.9% FLUSH: 3.0000 mL | Freq: Two times a day (BID) | INTRAVENOUS | Status: DC

## 2017-08-14 MED ORDER — BUPIVACAINE HCL (PF) 0.25 % IJ SOLN
INTRAMUSCULAR | Status: AC
  Filled 2017-08-14: qty 60

## 2017-08-14 MED ORDER — ACETAMINOPHEN 325 MG PO TABS: 650.0000 mg | ORAL_TABLET | ORAL | Status: DC | PRN

## 2017-08-14 MED ORDER — HEPARIN (PORCINE) IN NACL 2-0.9 UNIT/ML-% IJ SOLN
INTRAMUSCULAR | Status: AC
  Filled 2017-08-14: qty 500

## 2017-08-14 MED ORDER — BIOTIN 1000 MCG PO TABS: 1000.0000 ug | ORAL_TABLET | Freq: Every day | ORAL | Status: DC

## 2017-08-14 MED ORDER — HEPARIN (PORCINE) IN NACL 2-0.9 UNIT/ML-% IJ SOLN
INTRAMUSCULAR | Status: AC | PRN
  Administered 2017-08-14: 500 mL

## 2017-08-14 MED ORDER — MIDAZOLAM HCL 5 MG/5ML IJ SOLN
INTRAMUSCULAR | Status: DC | PRN
  Administered 2017-08-14: 2 mg via INTRAVENOUS
  Administered 2017-08-14 (×4): 1 mg via INTRAVENOUS

## 2017-08-14 SURGICAL SUPPLY — 11 items
BAG SNAP BAND KOVER 36X36 (MISCELLANEOUS) ×2 IMPLANT
CATH EZ STEER NAV 4MM D-F CUR (ABLATOR) ×2 IMPLANT
CATH JOSEPHSON QUAD-ALLRED 6FR (CATHETERS) ×4 IMPLANT
CATH WEBSTER BI DIR CS D-F CRV (CATHETERS) ×2 IMPLANT
PACK EP LATEX FREE (CUSTOM PROCEDURE TRAY) ×3
PACK EP LF (CUSTOM PROCEDURE TRAY) ×1 IMPLANT
PAD DEFIB LIFELINK (PAD) ×3 IMPLANT
PATCH CARTO3 (PAD) ×2 IMPLANT
SHEATH PINNACLE 6F 10CM (SHEATH) ×4 IMPLANT
SHEATH PINNACLE 7F 10CM (SHEATH) ×2 IMPLANT
SHEATH PINNACLE 8F 10CM (SHEATH) ×2 IMPLANT

## 2017-08-14 NOTE — H&P (Signed)
Brittany PandaBessie N Gombert has presented today for surgery, with the diagnosis of SVT.  The various methods of treatment have been discussed with the patient and family. After consideration of risks, benefits and other options for treatment, the patient has consented to  Procedure(s): ablation as a surgical intervention .  Risks include but not limited to bleeding, tamponade, heart block, stroke, damage to surrounding organs, among others. The patient's history has been reviewed, patient examined, no change in status, stable for surgery.  I have reviewed the patient's chart and labs.  Questions were answered to the patient's satisfaction.    Will Elberta Fortisamnitz, MD 08/14/2017 11:21 AM

## 2017-08-14 NOTE — Progress Notes (Signed)
Site Area: Bilateral Femoral Veins Site Prior to Removal: Level 0  Pressure Applied For: 20 Min Manual Pressure Pt Status During Pull:  Stable Post Pull Site: Level 0  Post Pull Instructions Given:  Yes Post Pull Pulses Present: Yes - Bilateral DP Dressing Applied: Yes Bedrest Begins @14 :05 til 20:05 Comments:

## 2017-08-14 NOTE — Progress Notes (Addendum)
Patient is seen post procedure VSS Telemetry is SR Patient  denies any CP, palpitations or SOB, no procedure site pain Activity restrictions and site care were reviewed with the patient Post procedure f/u is in place  Planned for discharge once bed rest is completed, after ambulation if patient and sites remain stable.  Francis Dowseenee Ursuy, PA-C  Loman BrooklynWill Sidni Fusco, MD

## 2017-08-14 NOTE — Discharge Instructions (Signed)
Post procedure care instructions °No driving for 4 days. No lifting over 5 lbs for 1 week. No vigorous or sexual activity for 1 week. You may return to work in one week. Keep procedure site clean & dry. If you notice increased pain, swelling, bleeding or pus, call/return!  You may shower, but no soaking baths/hot tubs/pools for 1 week.  ° ° °

## 2017-08-17 ENCOUNTER — Encounter (HOSPITAL_COMMUNITY): Payer: Self-pay | Admitting: Cardiology

## 2017-08-17 MED FILL — Fentanyl Citrate Preservative Free (PF) Inj 100 MCG/2ML: INTRAMUSCULAR | Qty: 2 | Status: AC

## 2017-09-04 ENCOUNTER — Encounter: Payer: Self-pay | Admitting: *Deleted

## 2017-09-15 ENCOUNTER — Encounter: Payer: Self-pay | Admitting: Cardiology

## 2017-09-15 ENCOUNTER — Encounter (INDEPENDENT_AMBULATORY_CARE_PROVIDER_SITE_OTHER): Payer: Self-pay

## 2017-09-15 ENCOUNTER — Ambulatory Visit: Payer: BC Managed Care – PPO | Admitting: Cardiology

## 2017-09-15 VITALS — BP 128/86 | HR 72 | Ht 63.0 in | Wt 125.0 lb

## 2017-09-15 DIAGNOSIS — I471 Supraventricular tachycardia: Secondary | ICD-10-CM | POA: Diagnosis not present

## 2017-09-15 NOTE — Patient Instructions (Signed)
Medication Instructions:  Your physician recommends that you continue on your current medications as directed. Please refer to the Current Medication list given to you today.  Labwork: None ordered  Testing/Procedures: None ordered  Follow-Up: No follow up is needed at this time with Dr. Camnitz.  He will see you on an as needed basis.  * If you need a refill on your cardiac medications before your next appointment, please call your pharmacy.   *Please note that any paperwork needing to be filled out by the provider will need to be addressed at the front desk prior to seeing the provider. Please note that any FMLA, disability or other documents regarding health condition is subject to a $25.00 charge that must be received prior to completion of paperwork in the form of a money order or check.  Thank you for choosing [facility]!!   Marialuisa Basara, RN [phone]         

## 2017-09-15 NOTE — Progress Notes (Signed)
Electrophysiology Office Note   Date:  09/15/2017   ID:  Brittany Murphy, DOB [birth year], MRN [id number]  PCP:  Maurice SmallGriffin, Elaine, MD  Cardiologist: Onnie BoerGregg Tayolr Primary Electrophysiologist: Will Jorja LoaMartin Camnitz, MD    Chief Complaint  Patient presents with  . Follow-up    Post SVT ablation     History of Present Illness: Brittany Murphy is a 62 y.o. female who presents today for electrophysiology evaluation.   She has a history of SVT seen by Dr. Ladona Ridgelaylor who suggested ablation.  Her ECG at that time showed NSR without pre-excitation.  She had a negative EP study on 04/30/15.  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  Today, denies symptoms of palpitations, chest pain, shortness of breath, orthopnea, PND, lower extremity edema, claudication, dizziness, presyncope, syncope, bleeding, or neurologic sequela. The patient is tolerating medications without difficulties.  She has had  occasional palpitations, only 5 seconds at a time.  This is happened only a few times since her ablation.   Past Medical History:  Diagnosis Date  . Anemia   . Anxiety 04/13/2014  . Constipation   . Palpitations   . SVT (supraventricular tachycardia) (HCC) 2012   Past Surgical History:  Procedure Laterality Date  . CHOLECYSTECTOMY  2004  . ELECTROPHYSIOLOGIC STUDY N/A 04/30/2015   Procedure: SVT Ablation;  Surgeon: Will Jorja LoaMartin Camnitz, MD;  Location: [facility] INVASIVE CV [facility];  Service: Cardiovascular;  Laterality: N/A;  . SUPRACERVICAL ABDOMINAL HYSTERECTOMY    . SVT ABLATION N/A 08/14/2017   Procedure: SVT ABLATION;  Surgeon: Regan Lemmingamnitz, Will Martin, MD;  [facility];  Service: Cardiovascular;  Laterality: N/A;     Current Outpatient Medications  Medication Sig Dispense Refill  . Biotin 1000 MCG tablet Take 1,000 mcg by mouth daily.    . Multiple Vitamin (MULTIVITAMIN WITH MINERALS) TABS tablet Take 1 tablet by mouth daily.     No current facility-administered  medications for this visit.     Allergies:   Metoprolol   Social History:  The patient  reports that she has never smoked. She has never used smokeless tobacco. She reports that she does not drink alcohol or use drugs.   Family History:  Per the patient, there are no health problems in her family.  ROS:  Please see the history of present illness.   Otherwise, review of systems is positive for palpitations.   All other systems are reviewed and negative.   PHYSICAL EXAM: VS:  BP 128/86   Pulse 72   Ht 5\' 3"  (1.6 m)   Wt 125 lb (56.7 kg)   BMI 22.14 kg/m  , BMI Body mass index is 22.14 kg/m. GEN: Well nourished, well developed, in no acute distress  HEENT: normal  Neck: no JVD, carotid bruits, or masses Cardiac: RRR; no murmurs, rubs, or gallops,no edema  Respiratory:  clear to auscultation bilaterally, normal work of breathing GI: soft, nontender, nondistended, + BS MS: no deformity or atrophy  Skin: warm and dry Neuro:  Strength and sensation are intact Psych: euthymic mood, full affect  EKG:  EKG is ordered today. Personal review of the ekg ordered  shows sinus rhythm, nonspecific T wave abnormalities, rate 72  Recent Labs: 08/04/2017: BUN 14; Creatinine, Ser 0.73; Potassium 4.4; Sodium 142 08/14/2017: Hemoglobin 12.2; Platelets 258    Lipid Panel     Component Value Date/Time   CHOL 133 03/11/2011 0545   TRIG 72 03/11/2011 0545   HDL  45 03/11/2011 0545   CHOLHDL 3.0 03/11/2011 0545   VLDL 14 03/11/2011 0545   LDLCALC 74 03/11/2011 0545     Wt Readings from Last 3 Encounters:  09/15/17 125 lb (56.7 kg)  08/14/17 125 lb (56.7 kg)  08/04/17 125 lb (56.7 kg)     ASSESSMENT AND PLAN:  1.  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  Had repeat palpitations and repeat EP study with ablation for AVNRT on 08/14/17.  Negative study 04/30/15.  No further prolonged SVT.  No changes at this time.  As needed  Current medicines are reviewed at length with the patient today.   The patient  does not have concerns regarding her medicines.  The following changes were made today: None  Labs/ tests ordered today include:  Orders Placed This Encounter  Procedures  . EKG 12-Lead     Disposition:   FU with Will Camnitz as needed  Signed, Will Jorja Loa, MD  09/15/2017 12:04 PM     [facility] [address] [place] [address] [phone] (office) [phone] (fax)

## 2018-04-28 ENCOUNTER — Telehealth: Payer: Self-pay | Admitting: *Deleted

## 2018-04-28 NOTE — Telephone Encounter (Signed)
Left message regarding travel clinic. Andree Coss, RN

## 2018-05-13 NOTE — Telephone Encounter (Signed)
RN left message again, returning patient's call.  Gave phone number for occupational health if she needs Yellow Fever - [phone].  Asked her to call  Back today if she does not need yellow fever and if she can make travel clinic tomorrow morning - [phone] Andree CossHowell, Clella Mckeel M, RN

## 2019-02-22 ENCOUNTER — Other Ambulatory Visit: Payer: Self-pay | Admitting: Family Medicine

## 2019-02-22 DIAGNOSIS — Z1231 Encounter for screening mammogram for malignant neoplasm of breast: Secondary | ICD-10-CM

## 2019-02-22 DIAGNOSIS — M8580 Other specified disorders of bone density and structure, unspecified site: Secondary | ICD-10-CM

## 2019-05-31 ENCOUNTER — Ambulatory Visit
Admission: RE | Admit: 2019-05-31 | Discharge: 2019-05-31 | Disposition: A | Discharge status: Home / Self Care | Payer: BC Managed Care – PPO | Source: Ambulatory Visit | Attending: Family Medicine | Admitting: Family Medicine

## 2019-05-31 ENCOUNTER — Other Ambulatory Visit: Payer: Self-pay

## 2019-05-31 DIAGNOSIS — Z1231 Encounter for screening mammogram for malignant neoplasm of breast: Secondary | ICD-10-CM

## 2019-05-31 DIAGNOSIS — M8580 Other specified disorders of bone density and structure, unspecified site: Secondary | ICD-10-CM

## 2019-09-15 ENCOUNTER — Ambulatory Visit: Payer: BC Managed Care – PPO | Attending: Family

## 2019-09-15 DIAGNOSIS — Z23 Encounter for immunization: Secondary | ICD-10-CM

## 2019-09-15 NOTE — Progress Notes (Signed)
   Covid-19 Vaccination Clinic  Name:  Brittany Murphy    MRN: [id number] DOB: January 15, 1956  09/15/2019  [name] was observed post Covid-19 immunization for 15 minutes without incident. She was provided with Vaccine Information Sheet and instruction to access the V-Safe system.   [name] was instructed to call 911 with any severe reactions post vaccine: Marland Kitchen Difficulty breathing  . Swelling of face and throat  . A fast heartbeat  . A bad rash all over body  . Dizziness and weakness   Immunizations Administered    Name Date Dose VIS Date Route   Moderna COVID-19 Vaccine 09/15/2019  1:49 PM 0.5 mL 05/24/2019 Intramuscular   Manufacturer: Moderna   Lot: [id number]   NDC: 68372-902-11

## 2019-10-18 ENCOUNTER — Ambulatory Visit: Payer: BC Managed Care – PPO | Attending: Family

## 2019-10-18 DIAGNOSIS — Z23 Encounter for immunization: Secondary | ICD-10-CM

## 2019-10-18 NOTE — Progress Notes (Signed)
   Covid-19 Vaccination Clinic  Name:  Brittany Murphy    MRN: [id number] DOB: [birth year]  10/18/2019  [name] was observed post Covid-19 immunization for 15 minutes without incident. She was provided with Vaccine Information Sheet and instruction to access the V-Safe system.   Ms. Pack was instructed to call 911 with any severe reactions post vaccine: Marland Kitchen Difficulty breathing  . Swelling of face and throat  . A fast heartbeat  . A bad rash all over body  . Dizziness and weakness   Immunizations Administered    Name Date Dose VIS Date Route   Moderna COVID-19 Vaccine 10/18/2019 11:44 AM 0.5 mL 05/2019 Intramuscular   Manufacturer: Moderna   Lot: [id number]   NDC: 42706-237-62

## 2020-03-02 ENCOUNTER — Other Ambulatory Visit (HOSPITAL_COMMUNITY)
Admission: RE | Admit: 2020-03-02 | Discharge: 2020-03-02 | Disposition: A | Discharge status: Home / Self Care | Payer: BC Managed Care – PPO | Source: Ambulatory Visit | Attending: Family Medicine | Admitting: Family Medicine

## 2020-03-02 ENCOUNTER — Other Ambulatory Visit: Payer: Self-pay | Admitting: Family Medicine

## 2020-03-02 DIAGNOSIS — Z124 Encounter for screening for malignant neoplasm of cervix: Secondary | ICD-10-CM | POA: Insufficient documentation

## 2020-03-06 LAB — CYTOLOGY - PAP: Diagnosis: NEGATIVE

## 2020-04-24 ENCOUNTER — Ambulatory Visit: Payer: BC Managed Care – PPO | Attending: Internal Medicine

## 2020-04-24 DIAGNOSIS — Z23 Encounter for immunization: Secondary | ICD-10-CM

## 2020-04-24 NOTE — Progress Notes (Signed)
   Covid-19 Vaccination Clinic  Name:  Koa Palla    MRN: [id number] DOB: 1955/10/22  04/24/2020  Ms. Daubenspeck was observed post Covid-19 immunization for 15 minutes without incident. She was provided with Vaccine Information Sheet and instruction to access the V-Safe system.   Ms. Rudy was instructed to call 911 with any severe reactions post vaccine: Marland Kitchen Difficulty breathing  . Swelling of face and throat  . A fast heartbeat  . A bad rash all over body  . Dizziness and weakness

## 2021-04-15 ENCOUNTER — Other Ambulatory Visit: Payer: Self-pay | Admitting: Family Medicine

## 2021-04-15 DIAGNOSIS — Z1231 Encounter for screening mammogram for malignant neoplasm of breast: Secondary | ICD-10-CM

## 2021-04-15 DIAGNOSIS — M8588 Other specified disorders of bone density and structure, other site: Secondary | ICD-10-CM

## 2021-05-24 ENCOUNTER — Ambulatory Visit: Payer: Self-pay | Attending: Family

## 2021-05-24 ENCOUNTER — Other Ambulatory Visit: Payer: Self-pay

## 2021-05-24 DIAGNOSIS — Z23 Encounter for immunization: Secondary | ICD-10-CM

## 2021-05-24 NOTE — Progress Notes (Signed)
   Covid-19 Vaccination Clinic  Name:  Brittany Murphy    MRN: [id number] DOB: 06-06-1956  05/24/2021  Brittany Murphy was observed post Covid-19 immunization for 15 minutes without incident. She was provided with Vaccine Information Sheet and instruction to access the V-Safe system.   Brittany Murphy was instructed to call 911 with any severe reactions post vaccine: Difficulty breathing  Swelling of face and throat  A fast heartbeat  A bad rash all over body  Dizziness and weakness   Immunizations Administered     Name Date Dose VIS Date Route   Moderna Covid-19 vaccine Bivalent Booster 05/24/2021 11:30 AM 0.5 mL 02/02/2021 Intramuscular   Manufacturer: Gala Murdoch   Lot: [id number]   NDC: 62229-798-92

## 2021-09-12 ENCOUNTER — Ambulatory Visit
Admission: RE | Admit: 2021-09-12 | Discharge: 2021-09-12 | Disposition: A | Discharge status: Home / Self Care | Payer: BC Managed Care – PPO | Source: Ambulatory Visit | Attending: Family Medicine | Admitting: Family Medicine

## 2021-09-12 DIAGNOSIS — M8588 Other specified disorders of bone density and structure, other site: Secondary | ICD-10-CM

## 2021-09-12 DIAGNOSIS — Z1231 Encounter for screening mammogram for malignant neoplasm of breast: Secondary | ICD-10-CM

## 2022-10-08 ENCOUNTER — Other Ambulatory Visit: Payer: Self-pay | Admitting: Internal Medicine

## 2022-10-08 DIAGNOSIS — Z1231 Encounter for screening mammogram for malignant neoplasm of breast: Secondary | ICD-10-CM

## 2022-10-09 ENCOUNTER — Ambulatory Visit
Admission: RE | Admit: 2022-10-09 | Discharge: 2022-10-09 | Disposition: A | Discharge status: Home / Self Care | Payer: BC Managed Care – PPO | Source: Ambulatory Visit | Attending: Internal Medicine | Admitting: Internal Medicine

## 2022-10-09 ENCOUNTER — Encounter: Payer: Self-pay | Admitting: Internal Medicine

## 2022-10-09 DIAGNOSIS — Z1231 Encounter for screening mammogram for malignant neoplasm of breast: Secondary | ICD-10-CM

## 2023-01-02 IMAGING — MG MM DIGITAL SCREENING BILAT W/ TOMO AND CAD
8 series · 9 of 24 positions shown · non-contrast
Comparison: Previous exam(s).

CLINICAL DATA: Screening.

EXAM:
DIGITAL SCREENING BILATERAL MAMMOGRAM WITH TOMOSYNTHESIS AND CAD
TECHNIQUE: Bilateral screening digital craniocaudal and mediolateral oblique
mammograms were obtained. Bilateral screening digital breast
tomosynthesis was performed. The images were evaluated with
computer-aided detection.

[L CC synth-2D]
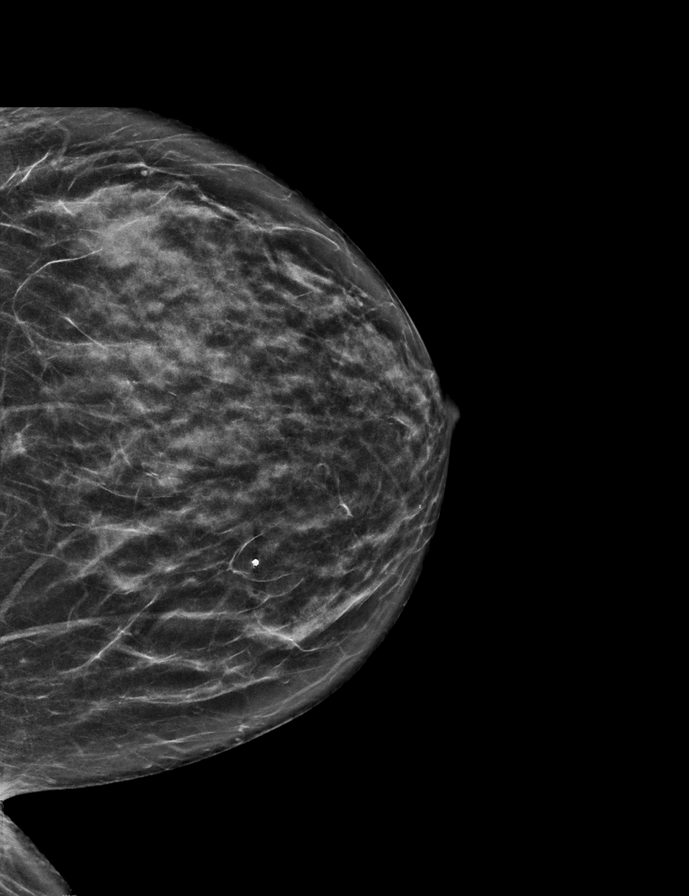

[L MLO synth-2D]
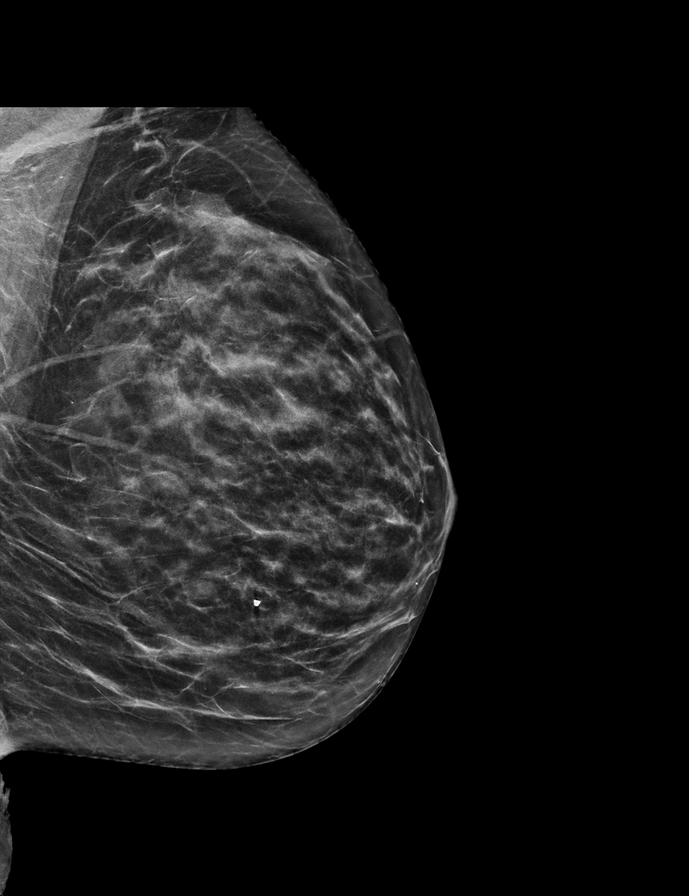

[R MLO synth-2D]
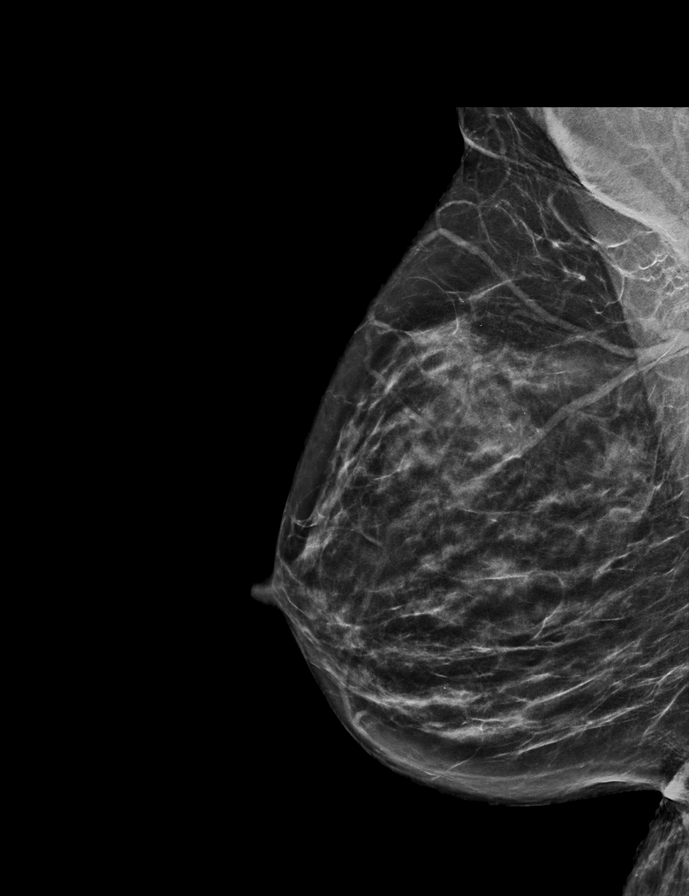

[R CC synth-2D]
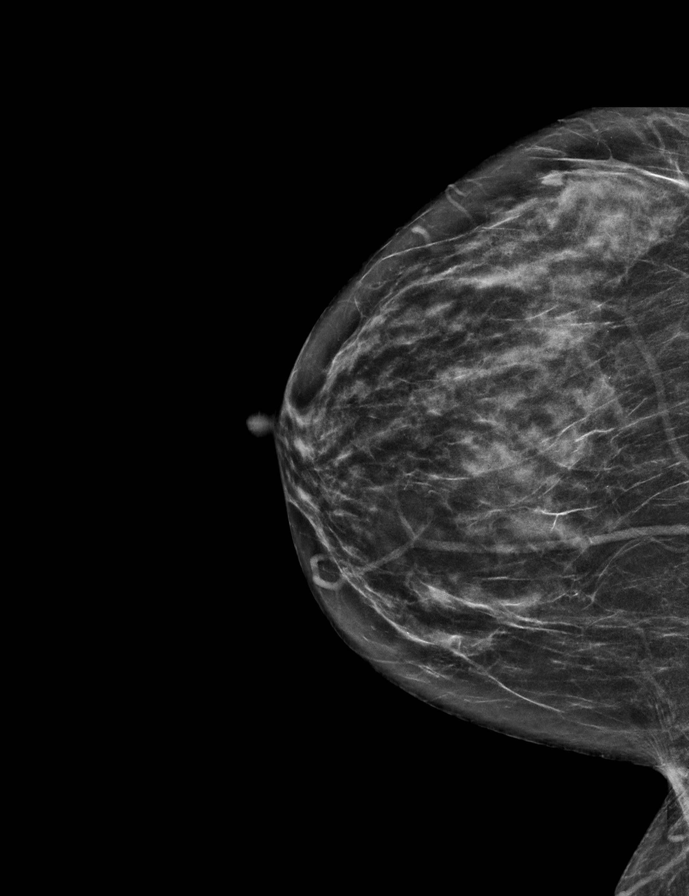

[R MLO tomo · 2 of 54 frames shown]
[frame 18/54]
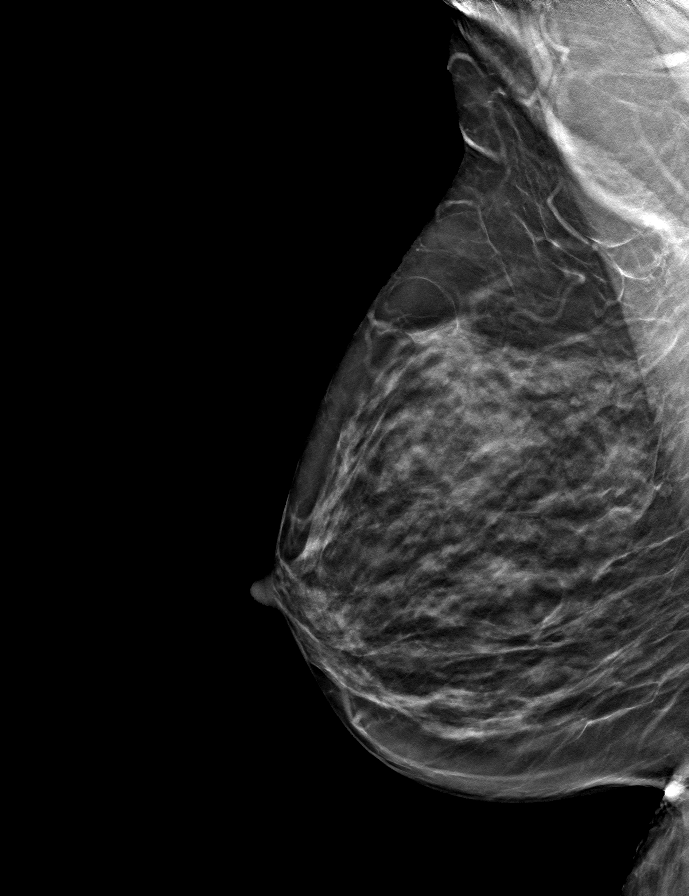
[frame 27/54]
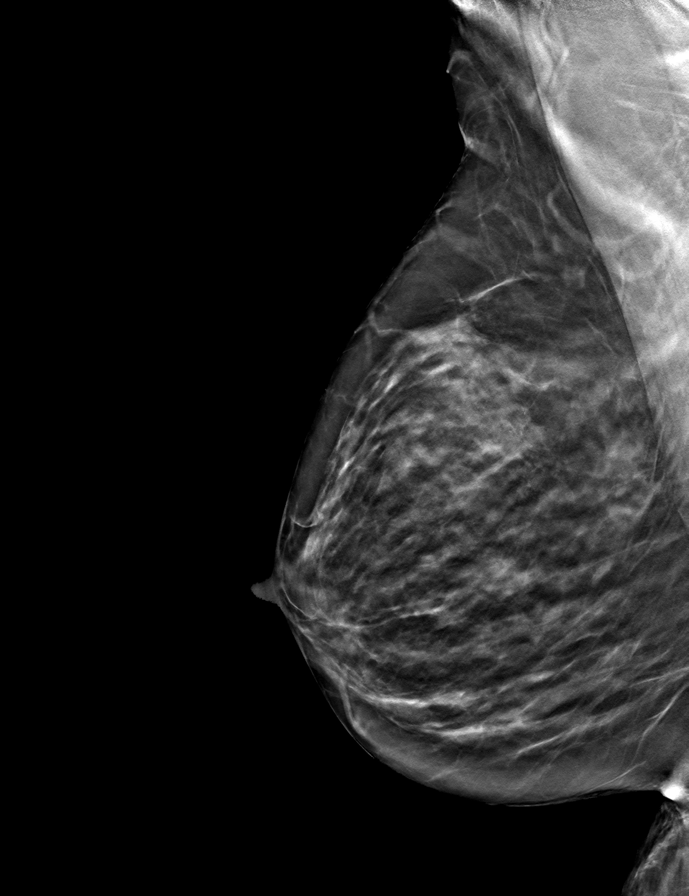

[R CC tomo · tomo slice 26/51.0]
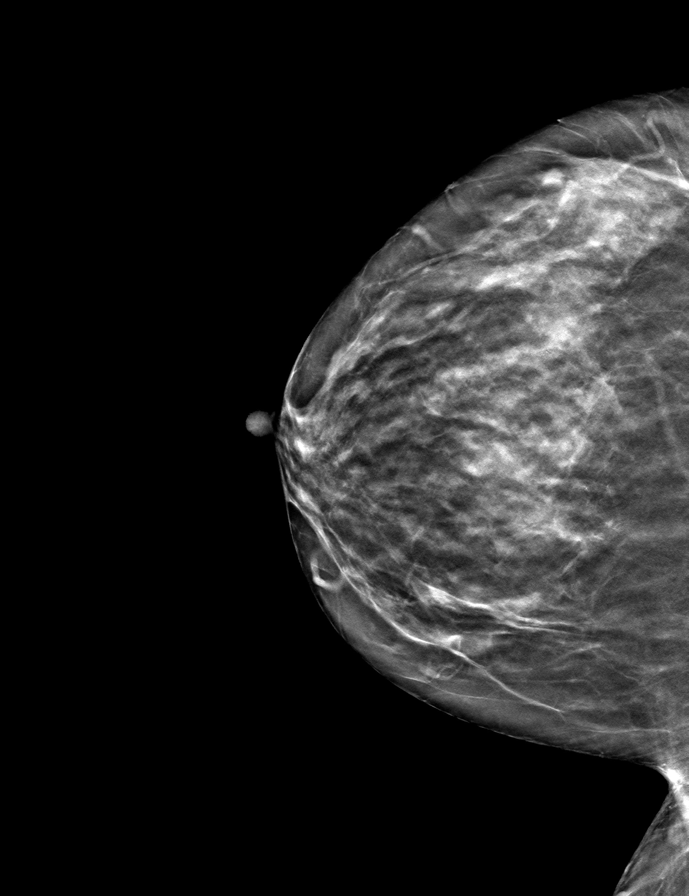

[L MLO tomo · tomo slice 26/51.0]
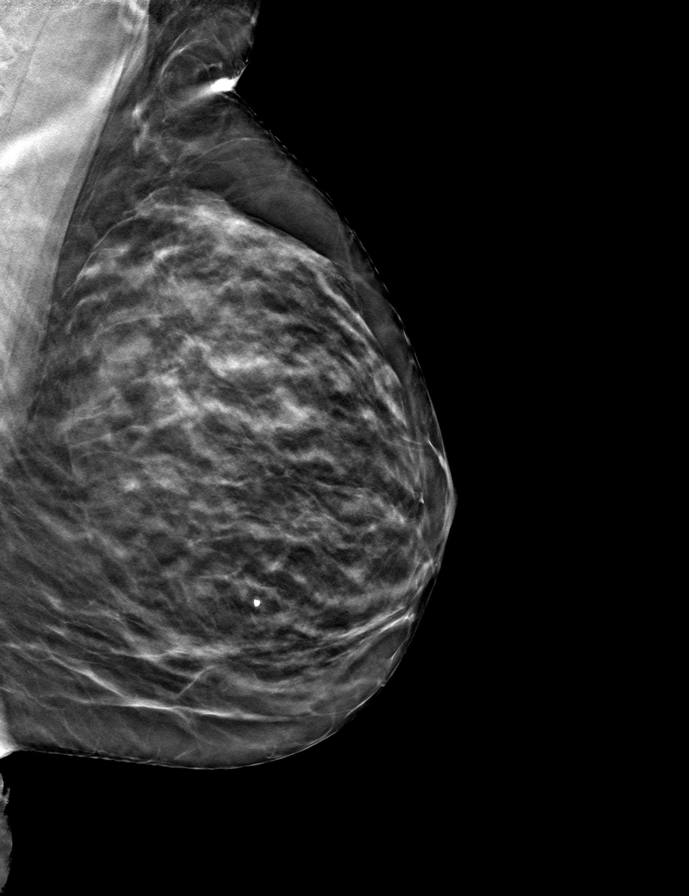

[L CC tomo · tomo slice 26/51.0]
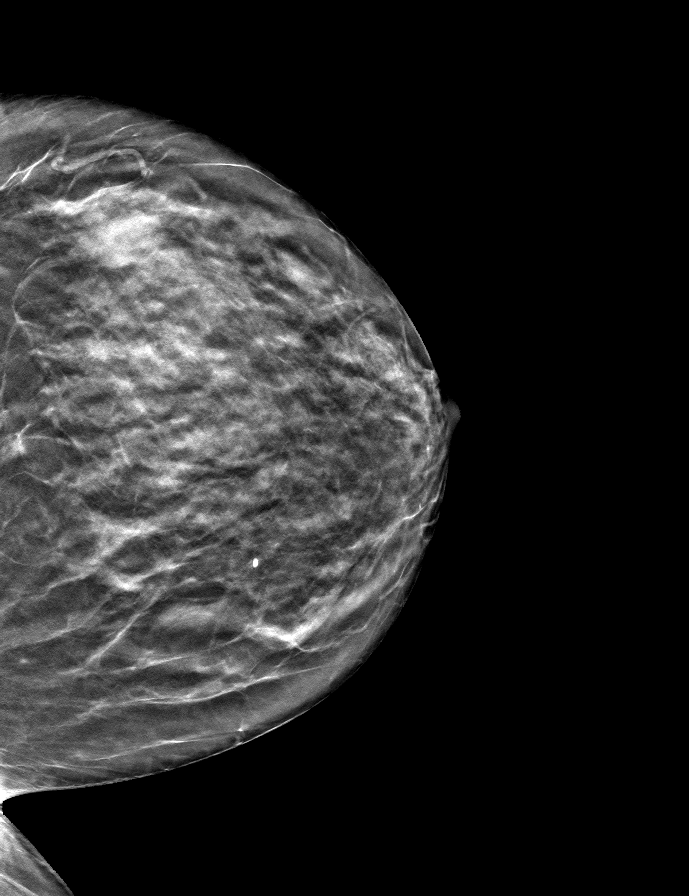

[9 of 24 positions shown; findings below may reference images not displayed]

ACR Breast Density Category c: The breast tissue is heterogeneously
dense, which may obscure small masses.
FINDINGS: There are no findings suspicious for malignancy.
IMPRESSION: No mammographic evidence of malignancy. A result letter of this
screening mammogram will be mailed directly to the patient.

RECOMMENDATION:
Screening mammogram in one year. (Code:Q3-W-BC3)

BI-RADS CATEGORY  1: Negative.

## 2023-09-30 ENCOUNTER — Other Ambulatory Visit: Payer: Self-pay

## 2023-09-30 ENCOUNTER — Other Ambulatory Visit: Payer: Self-pay | Admitting: Internal Medicine

## 2023-09-30 DIAGNOSIS — M8580 Other specified disorders of bone density and structure, unspecified site: Secondary | ICD-10-CM

## 2023-09-30 DIAGNOSIS — Z1231 Encounter for screening mammogram for malignant neoplasm of breast: Secondary | ICD-10-CM

## 2023-10-13 ENCOUNTER — Ambulatory Visit
Admission: RE | Admit: 2023-10-13 | Discharge: 2023-10-13 | Disposition: A | Discharge status: Home / Self Care | Payer: Self-pay | Source: Ambulatory Visit | Attending: Internal Medicine | Admitting: Internal Medicine

## 2023-10-13 DIAGNOSIS — Z1231 Encounter for screening mammogram for malignant neoplasm of breast: Secondary | ICD-10-CM

## 2024-06-07 ENCOUNTER — Other Ambulatory Visit: Payer: Self-pay

## 2024-09-01 ENCOUNTER — Other Ambulatory Visit (HOSPITAL_BASED_OUTPATIENT_CLINIC_OR_DEPARTMENT_OTHER)
